# Patient Record
Sex: Male | Born: 1940
Health system: Southern US, Community
[De-identification: ages and names within clinical notes are randomized; demographics above are authoritative.]

## PROBLEM LIST (undated history)

## (undated) DIAGNOSIS — K635 Polyp of colon: Secondary | ICD-10-CM

## (undated) DIAGNOSIS — F419 Anxiety disorder, unspecified: Secondary | ICD-10-CM

## (undated) DIAGNOSIS — R7303 Prediabetes: Secondary | ICD-10-CM

## (undated) DIAGNOSIS — I10 Essential (primary) hypertension: Secondary | ICD-10-CM

## (undated) DIAGNOSIS — M1990 Unspecified osteoarthritis, unspecified site: Secondary | ICD-10-CM

## (undated) DIAGNOSIS — E785 Hyperlipidemia, unspecified: Secondary | ICD-10-CM

## (undated) DIAGNOSIS — I639 Cerebral infarction, unspecified: Secondary | ICD-10-CM

## (undated) HISTORY — PX: EYE SURGERY: SHX253

## (undated) HISTORY — DX: Cerebral infarction, unspecified: I63.9

## (undated) HISTORY — DX: Essential (primary) hypertension: I10

## (undated) HISTORY — DX: Prediabetes: R73.03

## (undated) HISTORY — DX: Polyp of colon: K63.5

## (undated) HISTORY — PX: APPENDECTOMY: SHX54

## (undated) HISTORY — DX: Hyperlipidemia, unspecified: E78.5

## (undated) HISTORY — PX: HERNIA REPAIR: SHX51

---

## 2003-06-23 ENCOUNTER — Emergency Department (HOSPITAL_COMMUNITY): Admission: EM | Admit: 2003-06-23 | Discharge: 2003-06-24 | Payer: Self-pay | Admitting: Emergency Medicine

## 2003-06-23 ENCOUNTER — Encounter: Payer: Self-pay | Admitting: Emergency Medicine

## 2003-12-26 LAB — HM SIGMOIDOSCOPY: HM Sigmoidoscopy: NEGATIVE

## 2005-12-25 LAB — HM COLONOSCOPY

## 2010-12-28 ENCOUNTER — Other Ambulatory Visit: Payer: Self-pay | Admitting: Family Medicine

## 2010-12-28 ENCOUNTER — Ambulatory Visit
Admission: RE | Admit: 2010-12-28 | Discharge: 2010-12-28 | Payer: Self-pay | Source: Home / Self Care | Attending: Family Medicine | Admitting: Family Medicine

## 2010-12-28 DIAGNOSIS — Z8679 Personal history of other diseases of the circulatory system: Secondary | ICD-10-CM | POA: Insufficient documentation

## 2010-12-28 DIAGNOSIS — L821 Other seborrheic keratosis: Secondary | ICD-10-CM | POA: Insufficient documentation

## 2010-12-28 DIAGNOSIS — Z860100 Personal history of colon polyps, unspecified: Secondary | ICD-10-CM | POA: Insufficient documentation

## 2010-12-28 DIAGNOSIS — E785 Hyperlipidemia, unspecified: Secondary | ICD-10-CM | POA: Insufficient documentation

## 2010-12-28 DIAGNOSIS — I10 Essential (primary) hypertension: Secondary | ICD-10-CM | POA: Insufficient documentation

## 2010-12-28 DIAGNOSIS — R7309 Other abnormal glucose: Secondary | ICD-10-CM | POA: Insufficient documentation

## 2010-12-28 LAB — BASIC METABOLIC PANEL
BUN: 23 mg/dL (ref 6–23)
CO2: 30 mEq/L (ref 19–32)
Calcium: 9.6 mg/dL (ref 8.4–10.5)
Chloride: 97 mEq/L (ref 96–112)
Creatinine, Ser: 0.8 mg/dL (ref 0.4–1.5)
GFR: 99.31 mL/min (ref >60.00)
Glucose, Bld: 93 mg/dL (ref 70–99)
Potassium: 3.8 mEq/L (ref 3.5–5.1)
Sodium: 136 mEq/L (ref 135–145)

## 2010-12-28 LAB — LIPID PANEL
Cholesterol: 184 mg/dL (ref 0–200)
HDL: 28.4 mg/dL — ABNORMAL LOW (ref >39.00)
LDL Cholesterol: 117 mg/dL — ABNORMAL HIGH (ref 0–99)
Total CHOL/HDL Ratio: 6
Triglycerides: 195 mg/dL — ABNORMAL HIGH (ref 0.0–149.0)
VLDL: 39 mg/dL (ref 0.0–40.0)

## 2010-12-28 LAB — CONVERTED CEMR LAB
Cholesterol, target level: 200 mg/dL
HDL goal, serum: 40 mg/dL
LDL Goal: 130 mg/dL

## 2011-01-17 NOTE — Assessment & Plan Note (Signed)
Summary: NEW TO EST//ALP   Vital Signs:  Patient profile:   70 year old male Height:      70.78 inches Weight:      176 pounds BMI:     24.79 Temp:     98.1 degrees F oral Pulse rate:   60 / minute Pulse rhythm:   regular Resp:     12 per minute BP sitting:   130 / 88  (left arm) Cuff size:   regular  Vitals Entered By: Sid Falcon LPN (December 28, 2010 10:21 AM) CC: Hypertension Management, Lipid Management   History of Present Illness: Here to establish care. receives most of his care through the [facility].  PMH reviewed.  Hx hypertension, hyperlipidemia, colon polyps, subarachnoid hemorrhage 1995. ?MVP hx. Meds reviewed.  Compliant with meds and no side effects. Gets meds through [facility].  Pt also as reported hx of prediabetes with prior fasting glucose 117.  no symptoms of hyperglycemia.  Brownish colored skin lesions L side of face/head which are sometimes pruritic.  Nonbleeding.  treated in past by dermatology with liquid N but came back.  Hypertension History:      He denies headache, chest pain, palpitations, dyspnea with exertion, orthopnea, PND, peripheral edema, visual symptoms, neurologic problems, syncope, and side effects from treatment.        Positive major cardiovascular risk factors include male age 22 years old or older, hyperlipidemia, and hypertension.  Negative major cardiovascular risk factors include no history of diabetes and non-tobacco-user status.        Further assessment for target organ damage reveals no history of ASHD, stroke/TIA, or peripheral vascular disease.    Lipid Management History:      Positive NCEP/ATP III risk factors include male age 41 years old or older and hypertension.  Negative NCEP/ATP III risk factors include non-diabetic, non-tobacco-user status, no ASHD (atherosclerotic heart disease), no prior stroke/TIA, no peripheral vascular disease, and no history of aortic aneurysm.      Preventive Screening-Counseling &  Management  Alcohol-Tobacco     Smoking Status: never  Caffeine-Diet-Exercise     Does Patient Exercise: yes  Allergies (verified): No Known Drug Allergies  Past History:  Family History: Last updated: 12/28/2010 Father, heart disease 36 mother, diabetes type ll and ?colon cancer  Social History: Last updated: 12/28/2010 Retired Married Never Smoked Alcohol use-no Regular exercise-yes  Risk Factors: Exercise: yes (12/28/2010)  Risk Factors: Smoking Status: never (12/28/2010)  Past Medical History: Colonic polyps, hx of Hypertension Subarachnoid hemorrhage 1995 Hyperlipidemia ?mitral valve prolapse  Past Surgical History: hernia R 1966 hernia L 1983 Appendectomy  1953 PMH-FH-SH reviewed for relevance  Family History: Father, heart disease 70 mother, diabetes type ll and ?colon cancer  Social History: Retired Married Never Smoked Alcohol use-no Regular exercise-yes Smoking Status:  never Does Patient Exercise:  yes  Review of Systems  The patient denies anorexia, fever, weight loss, weight gain, vision loss, decreased hearing, hoarseness, chest pain, syncope, dyspnea on exertion, peripheral edema, prolonged cough, headaches, hemoptysis, abdominal pain, melena, hematochezia, severe indigestion/heartburn, hematuria, incontinence, genital sores, muscle weakness, suspicious skin lesions, transient blindness, difficulty walking, depression, unusual weight change, abnormal bleeding, enlarged lymph nodes, and testicular masses.    Physical Exam  General:  Well-developed,well-nourished,in no acute distress; alert,appropriate and cooperative throughout examination Head:  Normocephalic and atraumatic without obvious abnormalities. No apparent alopecia or balding. Mouth:  Oral mucosa and oropharynx without lesions or exudates.  Teeth in good repair. Neck:  No deformities, masses,  or tenderness noted. Lungs:  Normal respiratory effort, chest expands  symmetrically. Lungs are clear to auscultation, no crackles or wheezes. Heart:  Normal rate and regular rhythm. S1 and S2 normal without gallop, murmur, click, rub or other extra sounds. Abdomen:  Bowel sounds positive,abdomen soft and non-tender without masses, organomegaly or hernias noted. Extremities:  No clubbing, cyanosis, edema, or deformity noted with normal full range of motion of all joints.   Neurologic:  alert & oriented X3 and cranial nerves II-XII intact.   Skin:  L forehead and L temporal skin lesions which are brown, well demarcated and scaly surface.  Forehead lesion 6-7 mm diameter and temporal  14 mm diameter. Cervical Nodes:  No lymphadenopathy noted Psych:  good eye contact, not anxious appearing, and not depressed appearing.     Impression & Recommendations:  Problem # 1:  PREDIABETES (ICD-790.29)  Orders: Specimen Handling (16109) Venipuncture (60454) TLB-BMP (Basic Metabolic Panel-BMET) (80048-METABOL)  Problem # 2:  HYPERLIPIDEMIA (ICD-272.4)  Orders: Specimen Handling (09811) Venipuncture (91478) TLB-Lipid Panel (80061-LIPID)  Problem # 3:  CEREBROVASCULAR ACCIDENT, HX OF (ICD-V12.50)  Problem # 4:  HYPERTENSION (ICD-401.9)  His updated medication list for this problem includes:    Atenolol 50 Mg Tabs (Atenolol) ..... Once daily    Captopril 25 Mg Tabs (Captopril) .Marland Kitchen... 2 tabs two times a day    Hydrochlorothiazide 25 Mg Tabs (Hydrochlorothiazide) ..... Once daily  Problem # 5:  SEBORRHEIC KERATOSIS (ICD-702.19) Assessment: New  pt requests treatment.  Discussed risks and benefits of cryptherapy and pt consents.  L temporal lesion treated with liquid N.  Orders: Cryotherapy/Destruction benign or premalignant lesion (1st lesion)  (17000) Cryotherapy/Destruction benign or premalignant lesion (2nd-14th lesions) (17003)  Problem # 6:  COLONIC POLYPS, HX OF (ICD-V12.72)  Complete Medication List: 1)  Atenolol 50 Mg Tabs (Atenolol) .... Once  daily 2)  Captopril 25 Mg Tabs (Captopril) .... 2 tabs two times a day 3)  Hydrochlorothiazide 25 Mg Tabs (Hydrochlorothiazide) .... Once daily  Hypertension Assessment/Plan:      The patient's hypertensive risk group is category B: At least one risk factor (excluding diabetes) with no target organ damage.  Today's blood pressure is 130/88.    Lipid Assessment/Plan:      Based on NCEP/ATP III, the patient's risk factor category is "2 or more risk factors and a calculated 10 year CAD risk of > 20%".  The patient's lipid goals are as follows: Total cholesterol goal is 200; LDL cholesterol goal is 130; HDL cholesterol goal is 40; Triglyceride goal is 150.    Patient Instructions: 1)  Please schedule a follow-up appointment as needed .    Orders Added: 1)  Specimen Handling [99000] 2)  Venipuncture [36415] 3)  TLB-Lipid Panel [80061-LIPID] 4)  TLB-BMP (Basic Metabolic Panel-BMET) [80048-METABOL] 5)  New Patient Level III [29562] 6)  Cryotherapy/Destruction benign or premalignant lesion (1st lesion)  [17000] 7)  Cryotherapy/Destruction benign or premalignant lesion (2nd-14th lesions) [17003]   Immunization History:  Pneumovax Immunization History:    Pneumovax:  historical (12/17/2007)   Immunization History:  Pneumovax Immunization History:    Pneumovax:  Historical (12/17/2007)  Preventive Care Screening  Last Pneumovax:    Date:  12/17/2007    Results:  Historical   Last Tetanus Booster:    Date:  12/16/2006    Results:  Historical   Colonoscopy:    Date:  12/16/2005    Results:  abnormal      Preventive Care Screening  Last Pneumovax:  Date:  12/17/2007    Results:  Historical   Last Tetanus Booster:    Date:  12/16/2006    Results:  Historical   Colonoscopy:    Date:  12/16/2005    Results:  abnormal

## 2012-04-02 DIAGNOSIS — H4300 Vitreous prolapse, unspecified eye: Secondary | ICD-10-CM | POA: Diagnosis not present

## 2012-04-02 DIAGNOSIS — H43819 Vitreous degeneration, unspecified eye: Secondary | ICD-10-CM | POA: Diagnosis not present

## 2012-04-08 DIAGNOSIS — H4300 Vitreous prolapse, unspecified eye: Secondary | ICD-10-CM | POA: Diagnosis not present

## 2012-04-08 DIAGNOSIS — H5460 Unqualified visual loss, one eye, unspecified: Secondary | ICD-10-CM | POA: Diagnosis not present

## 2012-04-08 DIAGNOSIS — H43399 Other vitreous opacities, unspecified eye: Secondary | ICD-10-CM | POA: Diagnosis not present

## 2012-05-04 DIAGNOSIS — H01009 Unspecified blepharitis unspecified eye, unspecified eyelid: Secondary | ICD-10-CM | POA: Diagnosis not present

## 2012-05-04 DIAGNOSIS — H524 Presbyopia: Secondary | ICD-10-CM | POA: Diagnosis not present

## 2012-05-04 DIAGNOSIS — H5200 Hypermetropia, unspecified eye: Secondary | ICD-10-CM | POA: Diagnosis not present

## 2012-05-04 DIAGNOSIS — H52229 Regular astigmatism, unspecified eye: Secondary | ICD-10-CM | POA: Diagnosis not present

## 2012-12-25 ENCOUNTER — Ambulatory Visit (INDEPENDENT_AMBULATORY_CARE_PROVIDER_SITE_OTHER): Payer: Medicare Other | Admitting: Family Medicine

## 2012-12-25 ENCOUNTER — Encounter: Payer: Self-pay | Admitting: Family Medicine

## 2012-12-25 VITALS — BP 120/80 | Temp 98.7°F | Wt 183.0 lb

## 2012-12-25 DIAGNOSIS — G629 Polyneuropathy, unspecified: Secondary | ICD-10-CM

## 2012-12-25 DIAGNOSIS — G609 Hereditary and idiopathic neuropathy, unspecified: Secondary | ICD-10-CM

## 2012-12-25 DIAGNOSIS — E785 Hyperlipidemia, unspecified: Secondary | ICD-10-CM

## 2012-12-25 DIAGNOSIS — I10 Essential (primary) hypertension: Secondary | ICD-10-CM | POA: Diagnosis not present

## 2012-12-25 LAB — TSH: TSH: 1.82 u[IU]/mL (ref 0.35–5.50)

## 2012-12-25 LAB — BASIC METABOLIC PANEL
BUN: 27 mg/dL — ABNORMAL HIGH (ref 6–23)
CO2: 26 mEq/L (ref 19–32)
Calcium: 9.2 mg/dL (ref 8.4–10.5)
Chloride: 100 mEq/L (ref 96–112)
Creatinine, Ser: 1 mg/dL (ref 0.4–1.5)
GFR: 76.75 mL/min (ref >60.00)
Glucose, Bld: 105 mg/dL — ABNORMAL HIGH (ref 70–99)
Potassium: 3.7 mEq/L (ref 3.5–5.1)
Sodium: 136 mEq/L (ref 135–145)

## 2012-12-25 LAB — VITAMIN B12: Vitamin B-12: 476 pg/mL (ref 211–911)

## 2012-12-25 NOTE — Patient Instructions (Addendum)
Neuropathy Neuropathy means your peripheral nerves are not working normally. Peripheral nerves are the nerves outside the brain and spinal cord. Messages between the brain and the rest of the body do not work properly with peripheral nerve disorders. CAUSES There are many different causes of peripheral nerve disorders. These include:  Injury.   Infections.   Diabetes.   Vitamin deficiency.   Poor circulation.   Alcoholism.   Exposure to toxins.   Drug effects.   Tumors.   Kidney disease.  SYMPTOMS  Tingling, burning, pain, and numbness in the extremities.   Weakness and loss of muscle tone and size.  DIAGNOSIS Blood tests and special studies of nerve function may help confirm the diagnosis.  TREATMENT  Treatment includes adopting healthy life habits.   A good diet, vitamin supplements, and mild pain medicine may be needed.   Avoid known toxins such as alcohol, tobacco, and recreational drugs.   Anti-convulsant medicines are helpful in some types of neuropathy.  Make a follow-up appointment with your caregiver to be sure you are getting better with treatment.  SEEK IMMEDIATE MEDICAL CARE IF:   You have breathing problems.   You have severe or uncontrolled pain.   You notice extreme weakness or you feel faint.   You are not better after 1 week or if you have worse symptoms.  Document Released: 01/09/2005 Document Revised: 08/14/2011 Document Reviewed: 12/02/2005 ExitCare Patient Information 2012 [employer]. 

## 2012-12-25 NOTE — Progress Notes (Signed)
  Subjective:    Patient ID: Joseph Mcintyre, male    DOB: 12-27-1940, 72 y.o.   MRN: [id number]  HPI  Bilateral foot burning to "warm" pain/discomfort.  Better at night.  3/10 severity.  No leg involvement. No claudication.  Symptoms at rest.  No alleviating.  No exacerbating. ?hx prediabetes.  No symptoms of hyperglycemia. No prior hx of neuropathy.  No LBP. No upper extremity numbness.  Past Medical History  Diagnosis Date  . Cerebrovascular accident   . Colon polyps   . Hyperlipidemia   . Hypertension   . Prediabetes    Past Surgical History  Procedure Date  . Hernia repair 1983, 1966  . Appendectomy     reports that he has never smoked. He does not have any smokeless tobacco history on file. His alcohol and drug histories not on file. family history includes Diabetes in his mother and Heart disease in his father. No Known Allergies    Review of Systems  Constitutional: Negative for fever, chills, appetite change and unexpected weight change.  Respiratory: Negative for cough.   Cardiovascular: Negative for chest pain.  Gastrointestinal: Negative for abdominal pain.  Musculoskeletal: Negative for back pain.  Neurological: Positive for numbness. Negative for weakness.  Hematological: Negative for adenopathy. Does not bruise/bleed easily.       Objective:   Physical Exam  Constitutional: He appears well-developed and well-nourished.  Neck: Neck supple. No thyromegaly present.  Cardiovascular: Normal rate and regular rhythm.   Pulmonary/Chest: Effort normal and breath sounds normal. No respiratory distress. He has no wheezes. He has no rales.  Musculoskeletal: He exhibits no edema.       Feet are nontender. Good distal foot pulses. Feet are warm to touch with good capillary refill  Neurological:        Full-strength lower extremities. Normal sensory function to touch. Trace knee reflexes bilaterally and 1+ Achilles bilaterally          Assessment & Plan:    Bilateral neuropathy symptoms. Symptoms are relatively mild and symmetric. Check labs with basic metabolic panel, B12, and TSH. We discussed possible treatment options such as gabapentin at this point he wishes to wait symptoms are relatively mild. Followup promptly for any progressive symptoms or new symptoms such as weakness

## 2012-12-29 NOTE — Progress Notes (Signed)
Quick Note:  Pt wife informed ______ 

## 2013-03-17 ENCOUNTER — Encounter: Payer: Self-pay | Admitting: Family Medicine

## 2013-03-17 ENCOUNTER — Telehealth: Payer: Self-pay | Admitting: Family Medicine

## 2013-03-17 ENCOUNTER — Ambulatory Visit (INDEPENDENT_AMBULATORY_CARE_PROVIDER_SITE_OTHER): Payer: Medicare Other | Admitting: Family Medicine

## 2013-03-17 VITALS — BP 140/80 | Temp 98.2°F | Wt 182.0 lb

## 2013-03-17 DIAGNOSIS — H8110 Benign paroxysmal vertigo, unspecified ear: Secondary | ICD-10-CM | POA: Diagnosis not present

## 2013-03-17 NOTE — Progress Notes (Signed)
  Subjective:    Patient ID: Joseph Mcintyre, male    DOB: 26-Feb-1941, 72 y.o.   MRN: [id number]  HPI  Onset yesterday of vertigo about 12 noon. Had some nausea and vomiting yesterday but none today. No visual changes.  No speech changes. No focal weakness.  No ataxia.  Bilateral ear fullness yesterday.   No tinnitus. Vertigo worse with movement.   Similar, though milder, episodes in past.  Past Medical History  Diagnosis Date  . Cerebrovascular accident   . Colon polyps   . Hyperlipidemia   . Hypertension   . Prediabetes    Past Surgical History  Procedure Laterality Date  . Hernia repair  1983, 1966  . Appendectomy      reports that he has never smoked. He does not have any smokeless tobacco history on file. His alcohol and drug histories are not on file. family history includes Diabetes in his mother and Heart disease in his father. No Known Allergies    Review of Systems  Constitutional: Negative for fever, chills and unexpected weight change.  Respiratory: Negative for cough and shortness of breath.   Cardiovascular: Negative for chest pain.  Neurological: Positive for dizziness. Negative for tremors, seizures, syncope, weakness and light-headedness.  Psychiatric/Behavioral: Negative for confusion.       Objective:   Physical Exam  Constitutional: He is oriented to person, place, and time. He appears well-developed and well-nourished.  HENT:  Right Ear: External ear normal.  Left Ear: External ear normal.  Eyes: Pupils are equal, round, and reactive to light.  Neck: Neck supple.  No carotid bruit  Cardiovascular: Normal rate, regular rhythm and normal heart sounds.   Pulmonary/Chest: Effort normal and breath sounds normal. No respiratory distress. He has no wheezes. He has no rales.  Musculoskeletal: He exhibits no edema.  Neurological: He is alert and oriented to person, place, and time. No cranial nerve deficit.  No focal weakness. Cerebellar normal finger  to nose testing. Gait normal. No focal weakness          Assessment & Plan:  Vertigo. Suspect benign peripheral positional vertigo. Nonfocal exam. Symptoms are actually somewhat improved at this time. Reassurance. Followup promptly for any worsening symptoms or new symptoms

## 2013-03-17 NOTE — Patient Instructions (Addendum)
Benign Positional Vertigo  Vertigo means you feel like you or your surroundings are moving when they are not. Benign positional vertigo is the most common form of vertigo. Benign means that the cause of your condition is not serious. Benign positional vertigo is more common in older adults.  CAUSES   Benign positional vertigo is the result of an upset in the labyrinth system. This is an area in the middle ear that helps control your balance. This may be caused by a viral infection, head injury, or repetitive motion. However, often no specific cause is found.  SYMPTOMS   Symptoms of benign positional vertigo occur when you move your head or eyes in different directions. Some of the symptoms may include:  · Loss of balance and falls.  · Vomiting.  · Blurred vision.  · Dizziness.  · Nausea.  · Involuntary eye movements (nystagmus).  DIAGNOSIS   Benign positional vertigo is usually diagnosed by physical exam. If the specific cause of your benign positional vertigo is unknown, your caregiver may perform imaging tests, such as magnetic resonance imaging (MRI) or computed tomography (CT).  TREATMENT   Your caregiver may recommend movements or procedures to correct the benign positional vertigo. Medicines such as meclizine, benzodiazepines, and medicines for nausea may be used to treat your symptoms. In rare cases, if your symptoms are caused by certain conditions that affect the inner ear, you may need surgery.  HOME CARE INSTRUCTIONS   · Follow your caregiver's instructions.  · Move slowly. Do not make sudden body or head movements.  · Avoid driving.  · Avoid operating heavy machinery.  · Avoid performing any tasks that would be dangerous to you or others during a vertigo episode.  · Drink enough fluids to keep your urine clear or pale yellow.  SEEK IMMEDIATE MEDICAL CARE IF:   · You develop problems with walking, weakness, numbness, or using your arms, hands, or legs.  · You have difficulty speaking.  · You develop  severe headaches.  · Your nausea or vomiting continues or gets worse.  · You develop visual changes.  · Your family or friends notice any behavioral changes.  · Your condition gets worse.  · You have a fever.  · You develop a stiff neck or sensitivity to light.  MAKE SURE YOU:   · Understand these instructions.  · Will watch your condition.  · Will get help right away if you are not doing well or get worse.  Document Released: 09/09/2006 Document Revised: 02/24/2012 Document Reviewed: 08/22/2011  ExitCare® Patient Information ©2013 [employer].

## 2013-03-17 NOTE — Telephone Encounter (Signed)
Patient Information:  Caller Name: Jo  Phone: [phone]  Patient: Zidane, Renner  Gender: Male  DOB: 12/08/1941  Age: 72 Years  PCP: Evelena Peat [facility])  Office Follow Up:  Does the office need to follow up with this patient?: Yes  Instructions For The Office: Triage to office Now. Patient wants to see Dr. Caryl Never.  Scheduled at 13:15 today 03/17/13.  PLEASE ADVISE IF EARLIER SCHEDULING.   Symptoms  Reason For Call & Symptoms: Patient states he has been having symptoms of vertigo for 24 hours. dizzy and off balance and nausea. One episode of Vomiting. Mild headache. Decreased appetite. Assistance with help of wife.  Had to hold on to something going to bathroom.  Reviewed Health History In EMR: Yes  Reviewed Medications In EMR: Yes  Reviewed Allergies In EMR: Yes  Reviewed Surgeries / Procedures: Yes  Date of Onset of Symptoms: 03/16/2013  Treatments Tried: ASA  Treatments Tried Worked: No  Guideline(s) Used:  Dizziness  Disposition Per Guideline:   Go to Office Now  Reason For Disposition Reached:   Lightheadedness (dizziness) present now, after 2 hours of rest and fluids  Advice Given:  Some Causes of Temporary Dizziness:  Poor Fluid Intake - Not drinking enough fluids and being a little dehydrated is a common cause of temporary dizziness. This is always worse during hot weather.  Standing Up Suddenly - Standing up suddenly (especially getting out of bed) or prolonged standing in one place are common causes of temporary dizziness. Not drinking enough fluids always makes it worse. Certain medications can cause or increase this type of dizziness (e.g., blood pressure medications).  Heat Exposure - Hot weather, hot tubs, or too much sun exposure are common causes of temporary dizziness. Not drinking enough fluids always makes it worse.  Drink Fluids:  Drink several glasses of fruit juice, other clear fluids, or water. This will improve hydration and blood  glucose. If you have a fever or have had heat exposure, make sure the fluids are cold.  Rest for 1-2 Hours:  Lie down with feet elevated for 1 hour. This will improve blood flow and increase blood flow to the brain.  Stand Up Slowly:  In the mornings, sit up for a few minutes before you stand up. That will help your blood flow make the adjustment.  If you have to stand up for long periods of time, contract and relax your leg muscles to help pump the blood back to the heart.  Sit down or lie down if you feel dizzy.  Call Back If:  Passes out (faints)  You become worse.  Patient Will Follow Care Advice:  YES  Appointment Scheduled:  03/17/2013 13:15:00 Appointment Scheduled Provider:  Evelena Peat [facility])

## 2013-05-13 DIAGNOSIS — H5200 Hypermetropia, unspecified eye: Secondary | ICD-10-CM | POA: Diagnosis not present

## 2013-05-13 DIAGNOSIS — H01029 Squamous blepharitis unspecified eye, unspecified eyelid: Secondary | ICD-10-CM | POA: Diagnosis not present

## 2013-05-13 DIAGNOSIS — H524 Presbyopia: Secondary | ICD-10-CM | POA: Diagnosis not present

## 2013-05-13 DIAGNOSIS — H52229 Regular astigmatism, unspecified eye: Secondary | ICD-10-CM | POA: Diagnosis not present

## 2013-11-15 DIAGNOSIS — M109 Gout, unspecified: Secondary | ICD-10-CM | POA: Diagnosis not present

## 2013-11-19 DIAGNOSIS — M109 Gout, unspecified: Secondary | ICD-10-CM | POA: Diagnosis not present

## 2014-05-16 DIAGNOSIS — H01029 Squamous blepharitis unspecified eye, unspecified eyelid: Secondary | ICD-10-CM | POA: Diagnosis not present

## 2014-05-16 DIAGNOSIS — Z961 Presence of intraocular lens: Secondary | ICD-10-CM | POA: Diagnosis not present

## 2014-05-16 DIAGNOSIS — H35379 Puckering of macula, unspecified eye: Secondary | ICD-10-CM | POA: Diagnosis not present

## 2014-11-21 DIAGNOSIS — H01024 Squamous blepharitis left upper eyelid: Secondary | ICD-10-CM | POA: Diagnosis not present

## 2014-11-21 DIAGNOSIS — H01021 Squamous blepharitis right upper eyelid: Secondary | ICD-10-CM | POA: Diagnosis not present

## 2014-11-21 DIAGNOSIS — H35371 Puckering of macula, right eye: Secondary | ICD-10-CM | POA: Diagnosis not present

## 2015-05-23 DIAGNOSIS — H0234 Blepharochalasis left upper eyelid: Secondary | ICD-10-CM | POA: Diagnosis not present

## 2015-05-23 DIAGNOSIS — H0231 Blepharochalasis right upper eyelid: Secondary | ICD-10-CM | POA: Diagnosis not present

## 2015-05-23 DIAGNOSIS — H35371 Puckering of macula, right eye: Secondary | ICD-10-CM | POA: Diagnosis not present

## 2015-08-03 DIAGNOSIS — M7041 Prepatellar bursitis, right knee: Secondary | ICD-10-CM | POA: Diagnosis not present

## 2015-11-21 DIAGNOSIS — H35371 Puckering of macula, right eye: Secondary | ICD-10-CM | POA: Diagnosis not present

## 2016-02-07 ENCOUNTER — Encounter: Payer: Self-pay | Admitting: Family Medicine

## 2016-02-07 ENCOUNTER — Ambulatory Visit (INDEPENDENT_AMBULATORY_CARE_PROVIDER_SITE_OTHER): Payer: Medicare Other | Admitting: Family Medicine

## 2016-02-07 VITALS — BP 170/93 | HR 72 | Temp 98.8°F | Ht 71.0 in | Wt 172.0 lb

## 2016-02-07 DIAGNOSIS — S39012A Strain of muscle, fascia and tendon of lower back, initial encounter: Secondary | ICD-10-CM

## 2016-02-07 MED ORDER — CYCLOBENZAPRINE HCL 10 MG PO TABS: 10.0000 mg | ORAL_TABLET | Freq: Three times a day (TID) | ORAL | Status: DC | PRN

## 2016-02-07 MED ORDER — DICLOFENAC SODIUM 75 MG PO TBEC: 75.0000 mg | DELAYED_RELEASE_TABLET | Freq: Two times a day (BID) | ORAL | Status: DC

## 2016-02-07 NOTE — Progress Notes (Signed)
Pre visit review using our clinic review tool, if applicable. No additional management support is needed unless otherwise documented below in the visit note. 

## 2016-02-08 NOTE — Progress Notes (Signed)
   Subjective:    Patient ID: Joseph Mcintyre, male    DOB: 01-21-1941, 75 y.o.   MRN: [id number]  HPI Here for 2 weeks of low back pain that radiates into both buttocks. No numbness or weakness in the legs. This started after he spent an afternoon chopping wood. He has used heat and aspirin with partial relief.    Review of Systems  Constitutional: Negative.   Musculoskeletal: Positive for back pain.       Objective:   Physical Exam  Constitutional: He appears well-developed and well-nourished.  Musculoskeletal:  Mildly tender in the lower back with some spasm. Full ROM. Negative SLR           Assessment & Plan:  Low back strain. Use Flexeril and Diclofenac prn. Heat, stretches prn

## 2016-02-27 DIAGNOSIS — M5136 Other intervertebral disc degeneration, lumbar region: Secondary | ICD-10-CM | POA: Diagnosis not present

## 2016-02-27 DIAGNOSIS — S335XXA Sprain of ligaments of lumbar spine, initial encounter: Secondary | ICD-10-CM | POA: Diagnosis not present

## 2016-03-15 DIAGNOSIS — S335XXA Sprain of ligaments of lumbar spine, initial encounter: Secondary | ICD-10-CM | POA: Diagnosis not present

## 2016-03-18 DIAGNOSIS — M545 Low back pain: Secondary | ICD-10-CM | POA: Diagnosis not present

## 2016-03-20 DIAGNOSIS — M4806 Spinal stenosis, lumbar region: Secondary | ICD-10-CM | POA: Diagnosis not present

## 2016-04-01 DIAGNOSIS — M545 Low back pain: Secondary | ICD-10-CM | POA: Diagnosis not present

## 2016-04-01 DIAGNOSIS — M4806 Spinal stenosis, lumbar region: Secondary | ICD-10-CM | POA: Diagnosis not present

## 2016-04-17 DIAGNOSIS — M4806 Spinal stenosis, lumbar region: Secondary | ICD-10-CM | POA: Diagnosis not present

## 2016-04-26 DIAGNOSIS — M545 Low back pain: Secondary | ICD-10-CM | POA: Diagnosis not present

## 2016-04-26 DIAGNOSIS — M4806 Spinal stenosis, lumbar region: Secondary | ICD-10-CM | POA: Diagnosis not present

## 2016-05-10 DIAGNOSIS — M545 Low back pain: Secondary | ICD-10-CM | POA: Diagnosis not present

## 2016-05-20 DIAGNOSIS — H35371 Puckering of macula, right eye: Secondary | ICD-10-CM | POA: Diagnosis not present

## 2016-05-20 DIAGNOSIS — H01021 Squamous blepharitis right upper eyelid: Secondary | ICD-10-CM | POA: Diagnosis not present

## 2016-05-20 DIAGNOSIS — H01024 Squamous blepharitis left upper eyelid: Secondary | ICD-10-CM | POA: Diagnosis not present

## 2016-05-22 DIAGNOSIS — M545 Low back pain: Secondary | ICD-10-CM | POA: Diagnosis not present

## 2016-05-22 DIAGNOSIS — M5136 Other intervertebral disc degeneration, lumbar region: Secondary | ICD-10-CM | POA: Diagnosis not present

## 2016-05-22 DIAGNOSIS — M4806 Spinal stenosis, lumbar region: Secondary | ICD-10-CM | POA: Diagnosis not present

## 2016-06-13 ENCOUNTER — Ambulatory Visit (INDEPENDENT_AMBULATORY_CARE_PROVIDER_SITE_OTHER): Payer: Medicare Other | Admitting: Podiatry

## 2016-06-13 ENCOUNTER — Encounter: Payer: Self-pay | Admitting: Podiatry

## 2016-06-13 ENCOUNTER — Ambulatory Visit (INDEPENDENT_AMBULATORY_CARE_PROVIDER_SITE_OTHER): Payer: Medicare Other

## 2016-06-13 VITALS — BP 150/86 | HR 63 | Resp 12

## 2016-06-13 DIAGNOSIS — M21619 Bunion of unspecified foot: Secondary | ICD-10-CM | POA: Diagnosis not present

## 2016-06-13 DIAGNOSIS — M79672 Pain in left foot: Secondary | ICD-10-CM

## 2016-06-13 DIAGNOSIS — M1000 Idiopathic gout, unspecified site: Secondary | ICD-10-CM

## 2016-06-13 DIAGNOSIS — M79671 Pain in right foot: Secondary | ICD-10-CM

## 2016-06-13 DIAGNOSIS — M779 Enthesopathy, unspecified: Secondary | ICD-10-CM

## 2016-06-13 MED ORDER — TRIAMCINOLONE ACETONIDE 10 MG/ML IJ SUSP
10.0000 mg | Freq: Once | INTRAMUSCULAR | Status: AC
  Administered 2016-06-13: 10 mg

## 2016-06-13 NOTE — Progress Notes (Signed)
   Subjective:    Patient ID: KADARI FIER, male    DOB: February 03, 1941, 75 y.o.   MRN: [id number]  HPI  Chief Complaint  Patient presents with  . Foot Pain    ''b/l feet get sore sometimes.''   PT STATED LT FOOT 1ST JOINT IS MORE PAINFUL THAN RT FOOT. FEET ARE WORSE AT NIGHT. TRIED ULORIC AND DICLOFENAC PRESCRIBE BY DR. Sarajane Jews  Review of Systems  Musculoskeletal: Positive for joint swelling.  Skin: Positive for color change.       Objective:   Physical Exam        Assessment & Plan:

## 2016-06-13 NOTE — Progress Notes (Signed)
Subjective:     Patient ID: Joseph Mcintyre, male   DOB: December 21, 1940, 75 y.o.   MRN: [id number]  HPI patient presents stating that he's had a lot of inflammation around the big toe joint left with a tentative diagnosis of gout. He's tried [place] and diclofenac without relief and the pain has been relatively consistent or the last several months. States that he's not sure as to what he may have done but it's been red and irritated   Review of Systems  All other systems reviewed and are negative.      Objective:   Physical Exam  Constitutional: He is oriented to person, place, and time.  Cardiovascular: Intact distal pulses.   Musculoskeletal: Normal range of motion.  Neurological: He is oriented to person, place, and time.  Skin: Skin is warm.  Nursing note and vitals reviewed.  Neurovascular status found to be intact muscle strength is adequate range of motion within normal limits with patient found to have inflammation around the left first MPJ with prominence and enlargement and fluid buildup. The right one looks fairly normal with slight enlargement noted and there is quite a bit of discomfort when I pressed around the left first MPJ. There is slight range of motion loss crepitus within the joint and patient's found to be well oriented 3 with good digital perfusion     Assessment:     Inflammatory capsulitis first MPJ left with fluid buildup with possibility for previous gout attacks with structural bunion deformity present clinically    Plan:     H&P and x-rays reviewed. Today I went ahead and I did careful capsular injection around this area 3 mg Kenalog 5 mg Xylocaine and advised on wider shoes. We will reevaluate in 1 month and if symptoms are persisting work and the need to consider structural correction  X-ray report indicated there is slight enlargement around the first metatarsal left with fluid buildup and indications mildly of hallux limitus condition

## 2016-06-14 ENCOUNTER — Encounter (HOSPITAL_COMMUNITY): Payer: Self-pay | Admitting: Emergency Medicine

## 2016-06-14 ENCOUNTER — Emergency Department (HOSPITAL_COMMUNITY): Payer: Medicare Other

## 2016-06-14 ENCOUNTER — Emergency Department (HOSPITAL_COMMUNITY)
Admission: EM | Admit: 2016-06-14 | Discharge: 2016-06-14 | Disposition: A | Discharge status: Home / Self Care | Payer: Medicare Other | Attending: Emergency Medicine | Admitting: Emergency Medicine

## 2016-06-14 DIAGNOSIS — R0600 Dyspnea, unspecified: Secondary | ICD-10-CM

## 2016-06-14 DIAGNOSIS — Z79899 Other long term (current) drug therapy: Secondary | ICD-10-CM | POA: Insufficient documentation

## 2016-06-14 DIAGNOSIS — R0602 Shortness of breath: Secondary | ICD-10-CM | POA: Diagnosis not present

## 2016-06-14 DIAGNOSIS — I10 Essential (primary) hypertension: Secondary | ICD-10-CM | POA: Insufficient documentation

## 2016-06-14 DIAGNOSIS — E785 Hyperlipidemia, unspecified: Secondary | ICD-10-CM | POA: Diagnosis not present

## 2016-06-14 LAB — CBC WITH DIFFERENTIAL/PLATELET
Basophils Absolute: 0 10*3/uL (ref 0.0–0.1)
Basophils Relative: 0 %
Eosinophils Absolute: 0 10*3/uL (ref 0.0–0.7)
Eosinophils Relative: 0 %
HEMATOCRIT: 43.4 % (ref 39.0–52.0)
HEMOGLOBIN: 14.8 g/dL (ref 13.0–17.0)
LYMPHS ABS: 1.9 10*3/uL (ref 0.7–4.0)
Lymphocytes Relative: 19 %
MCH: 29.5 pg (ref 26.0–34.0)
MCHC: 34.1 g/dL (ref 30.0–36.0)
MCV: 86.5 fL (ref 78.0–100.0)
MONO ABS: 0.6 10*3/uL (ref 0.1–1.0)
MONOS PCT: 6 %
NEUTROS ABS: 7.5 10*3/uL (ref 1.7–7.7)
Neutrophils Relative %: 75 %
Platelets: 201 10*3/uL (ref 150–400)
RBC: 5.02 MIL/uL (ref 4.22–5.81)
RDW: 12.7 % (ref 11.5–15.5)
WBC: 10 10*3/uL (ref 4.0–10.5)

## 2016-06-14 LAB — TROPONIN I

## 2016-06-14 LAB — D-DIMER, QUANTITATIVE: D-Dimer, Quant: 0.42 ug/mL-FEU (ref 0.00–0.50)

## 2016-06-14 LAB — COMPREHENSIVE METABOLIC PANEL
ALK PHOS: 54 U/L (ref 38–126)
ALT: 16 U/L — AB (ref 17–63)
ANION GAP: 9 (ref 5–15)
AST: 20 U/L (ref 15–41)
Albumin: 4.1 g/dL (ref 3.5–5.0)
BILIRUBIN TOTAL: 1 mg/dL (ref 0.3–1.2)
BUN: 18 mg/dL (ref 6–20)
CALCIUM: 9.7 mg/dL (ref 8.9–10.3)
CO2: 29 mmol/L (ref 22–32)
CREATININE: 0.85 mg/dL (ref 0.61–1.24)
Chloride: 98 mmol/L — ABNORMAL LOW (ref 101–111)
Glucose, Bld: 103 mg/dL — ABNORMAL HIGH (ref 65–99)
Potassium: 3.9 mmol/L (ref 3.5–5.1)
Sodium: 136 mmol/L (ref 135–145)
TOTAL PROTEIN: 6.8 g/dL (ref 6.5–8.1)

## 2016-06-14 MED ORDER — LORAZEPAM 1 MG PO TABS
1.0000 mg | ORAL_TABLET | Freq: Once | ORAL | Status: AC
  Administered 2016-06-14: 1 mg via ORAL
  Filled 2016-06-14: qty 1

## 2016-06-14 NOTE — ED Notes (Signed)
Pt c/o [facility] for several days and also had an episode of syncope. History of toe injection yesterday for gout

## 2016-06-14 NOTE — ED Provider Notes (Addendum)
CSN: [id number]     Arrival date & time 06/14/16  [id number] History   First MD Initiated Contact with Patient 06/14/16 [phone]     Chief Complaint  Patient presents with  . Shortness of Breath    SHOB for several days     HPI Pt with transient intermittent SOB x 3-4 days. Initially began as a feeling of not feeling well while driving, followed by dimming of his vision and feeling like he was going to pass out, with unclear transient LOC but quickly came to and continued driving 4 days ago. No CP at that time or palpitations. No fever or chills or cough. Reports since that event he has awoken with SOB in the morning for past several days followed by relatively normal afternoons. Reports at times he feels anxious. Spouse reports poor sleep for several night. Pt denies orthopnea or leg swelling. No hx of CHF. Asymptomatic at this time. No hx of PE. Wife reports 20lb weight loss over two months but admits to changes in the way they cook and eat at home during that period of time. No hx of CAD. Transient smoker in his teens, but nothing sustained. Reports medication compliance.     Past Medical History  Diagnosis Date  . Cerebrovascular accident ([place])   . Colon polyps   . Hyperlipidemia   . Hypertension   . Prediabetes    Past Surgical History  Procedure Laterality Date  . Hernia repair  1983, 1966  . Appendectomy     Family History  Problem Relation Age of Onset  . Diabetes Mother     type ll  . Heart disease Father    Social History  Substance Use Topics  . Smoking status: Never Smoker   . Smokeless tobacco: Never Used  . Alcohol Use: No    Review of Systems  All other systems reviewed and are negative.     Allergies  Review of patient's allergies indicates no known allergies.  Home Medications   Prior to Admission medications   Medication Sig Start Date End Date Taking? Authorizing Provider  atenolol (TENORMIN) 50 MG tablet Take 50 mg by mouth daily.   Yes Historical  Provider, MD  captopril (CAPOTEN) 25 MG tablet Take 50 mg by mouth 2 (two) times daily.    Yes Historical Provider, MD  hydrochlorothiazide (HYDRODIURIL) 25 MG tablet Take 25 mg by mouth daily.   Yes Historical Provider, MD  traMADol (ULTRAM) 50 MG tablet Take 50 mg by mouth every 6 (six) hours as needed for moderate pain.   Yes Historical Provider, MD  cyclobenzaprine (FLEXERIL) 10 MG tablet Take 1 tablet (10 mg total) by mouth 3 (three) times daily as needed for muscle spasms. Patient not taking: Reported on 06/14/2016 02/07/16   Laurey Morale, MD  diclofenac (VOLTAREN) 75 MG EC tablet Take 1 tablet (75 mg total) by mouth 2 (two) times daily. Patient not taking: Reported on 06/14/2016 02/07/16   Laurey Morale, MD   BP 131/77 mmHg  Pulse 74  Temp(Src) 98.1 F (36.7 C) (Oral)  Resp 16  SpO2 95% Physical Exam  Constitutional: He is oriented to person, place, and time. He appears well-developed and well-nourished.  HENT:  Head: Normocephalic and atraumatic.  Eyes: EOM are normal.  Neck: Normal range of motion.  Cardiovascular: Normal rate, regular rhythm, normal heart sounds and intact distal pulses.   Pulmonary/Chest: Effort normal and breath sounds normal. No respiratory distress.  Abdominal: Soft. He exhibits no distension.  There is no tenderness.  Musculoskeletal: Normal range of motion.  Neurological: He is alert and oriented to person, place, and time.  Skin: Skin is warm and dry.  Psychiatric: He has a normal mood and affect. Judgment normal.  Nursing note and vitals reviewed.   ED Course  Procedures (including critical care time) Labs Review Labs Reviewed  COMPREHENSIVE METABOLIC PANEL - Abnormal; Notable for the following:    Chloride 98 (*)    Glucose, Bld 103 (*)    ALT 16 (*)    All other components within normal limits  CBC WITH DIFFERENTIAL/PLATELET  D-DIMER, QUANTITATIVE (NOT AT [facility])  TROPONIN I    Imaging Review Dg Chest 2 View  06/14/2016  CLINICAL DATA:   Shortness of Breath EXAM: CHEST  2 VIEW COMPARISON:  None. FINDINGS: There is no edema or consolidation. The heart size and pulmonary vascularity are normal. There is atherosclerotic calcification in the aorta. No adenopathy. No bone lesions. There is calcification in the carotid arteries bilaterally. IMPRESSION: No edema or consolidation. Aortic atherosclerosis. Foci of carotid artery calcification bilaterally also noted. Electronically Signed   By: Lowella Grip III M.D.   On: 06/14/2016 11:38   I have personally reviewed and evaluated these images and lab results as part of my medical decision-making.   EKG Interpretation #1 Date/Time:  Friday June 14 2016 09:44:34 EDT Ventricular Rate:  67 PR Interval:    QRS Duration: 96 QT Interval:  398 QTC Calculation: 421 R Axis:   -10 Text Interpretation:  Sinus rhythm No old tracing to compare Confirmed by  Keiran Sias  MD, Josten Warmuth ([id number]) on 06/14/2016 10:44:10 AM     EKG Interpretation #2  Date/Time:  Friday June 14 2016 12:30:49 EDT Ventricular Rate:  75 PR Interval:    QRS Duration: 94 QT Interval:  389 QTC Calculation: 435 R Axis:   3 Text Interpretation:  Sinus rhythm Abnormal R-wave progression, early transition No significant change was found Confirmed by Oran Dillenburg  MD, Larico Dimock ([id number]) on 06/14/2016 10:23:48 PM            MDM   Final diagnoses:  Dyspnea    Pt had a transient episode while in ER of SOB. No arrhythmia noted. No ecg changes noted. Resolved on its own. Feels better after ativan. ecg without ischemic changes. Work up without significant abnormality. Dc home with primary care and cardiology follow up. Some of this sounds like it could represent anxiety but he will likely benefit from outpatient echo and possible holter monitor. He and is family understand to return to the ER for new or worsening symptoms. All questions answered  Ambulated in the hall prior to discharge without difficulty. No hypoxia. Well appearing. No  increased work of breathing  Jola Schmidt, MD 06/14/16 [place], MD 06/14/16 [id number]

## 2016-06-14 NOTE — ED Notes (Signed)
Patient transported to X-ray 

## 2016-06-17 ENCOUNTER — Encounter: Payer: Self-pay | Admitting: Family Medicine

## 2016-06-17 ENCOUNTER — Ambulatory Visit (INDEPENDENT_AMBULATORY_CARE_PROVIDER_SITE_OTHER): Payer: Medicare Other | Admitting: Family Medicine

## 2016-06-17 ENCOUNTER — Telehealth: Payer: Self-pay | Admitting: *Deleted

## 2016-06-17 VITALS — BP 110/70 | HR 78 | Temp 98.2°F | Resp 16 | Ht 71.0 in | Wt 155.0 lb

## 2016-06-17 DIAGNOSIS — Z789 Other specified health status: Secondary | ICD-10-CM

## 2016-06-17 DIAGNOSIS — R5383 Other fatigue: Secondary | ICD-10-CM | POA: Diagnosis not present

## 2016-06-17 DIAGNOSIS — R55 Syncope and collapse: Secondary | ICD-10-CM

## 2016-06-17 DIAGNOSIS — R6889 Other general symptoms and signs: Secondary | ICD-10-CM

## 2016-06-17 LAB — SEDIMENTATION RATE: Sed Rate: 13 mm/hr (ref 0–20)

## 2016-06-17 LAB — TSH: TSH: 1.13 u[IU]/mL (ref 0.35–4.50)

## 2016-06-17 MED ORDER — LORAZEPAM 0.5 MG PO TABS: 0.5000 mg | ORAL_TABLET | Freq: Four times a day (QID) | ORAL | Status: DC | PRN

## 2016-06-17 NOTE — Patient Instructions (Signed)
Follow up for any recurrent dizziness or syncope.

## 2016-06-17 NOTE — Progress Notes (Signed)
Pre visit review using our clinic review tool, if applicable. No additional management support is needed unless otherwise documented below in the visit note. 
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
## 2016-06-17 NOTE — Progress Notes (Signed)
Subjective:    Patient ID: Joseph Mcintyre, male    DOB: Oct 24, 1941, 75 y.o.   MRN: [id number]  HPI Patient seen for ER follow-up.  He states that one week ago today use driving along with his wife and had very sudden onset of presyncopal type symptoms which lasted just a few seconds. He denies ever losing consciousness. He denied any associated chest pains or shortness of breath with that episode. No confusion. He states he felt "as if he were going to black out " He has not had any episodes since then.  He also relates having some intermittent shortness of breath (nonexertional) recently. He feels anxious during these episodes. He went to emergency room last Friday for further evaluation. He's also had about 18-20 pounds of weight loss over the past several months. They have made some dietary changes but he states his appetite is diminished somewhat. He states he has never had exertional dyspnea and in fact he does not notice any symptoms of dyspnea when being active. Symptoms have occurred only at rest.  ER notes reviewed. He had comprehensive metabolic panel, troponin, d-dimer, CBC all unremarkable. Chest x-ray no acute findings. EKG no acute changes. He started to feel some shortness of breath during ER visit and there were no corresponding EKG changes or arrhythmias. Patient was given 1 dose of lorazepam and symptoms seemed to improve.  He has been getting some epidurals for lumbar stenosis. Most recent injection was June 8.  Patient smoked only briefly in his teens. He has hypertension which is currently treated with atenolol, captopril, and HCTZ.  Past Medical History  Diagnosis Date  . Cerebrovascular accident ([place])   . Colon polyps   . Hyperlipidemia   . Hypertension   . Prediabetes    Past Surgical History  Procedure Laterality Date  . Hernia repair  1983, 1966  . Appendectomy      reports that he has never smoked. He has never used smokeless tobacco. He reports that he  does not drink alcohol or use illicit drugs. family history includes Diabetes in his mother; Heart disease in his father. No Known Allergies    Review of Systems  Constitutional: Positive for appetite change, fatigue and unexpected weight change. Negative for fever and chills.  HENT: Negative for trouble swallowing.   Respiratory: Positive for shortness of breath.   Cardiovascular: Negative for chest pain, palpitations and leg swelling.  Gastrointestinal: Negative for nausea, vomiting and abdominal pain.  Genitourinary: Negative for dysuria.  Musculoskeletal: Positive for back pain.  Neurological: Positive for dizziness. Negative for seizures, syncope, speech difficulty and headaches.  Psychiatric/Behavioral: The patient is nervous/anxious.        Objective:   Physical Exam  Constitutional: He is oriented to person, place, and time. He appears well-developed and well-nourished.  HENT:  Mouth/Throat: Oropharynx is clear and moist.  Neck: Neck supple. No thyromegaly present.  Cardiovascular: Normal rate and regular rhythm.  Exam reveals no gallop.   No murmur heard. Pulmonary/Chest: Effort normal and breath sounds normal. No respiratory distress. He has no wheezes. He has no rales.  Abdominal: Soft. Bowel sounds are normal. He exhibits no distension and no mass. There is no tenderness. There is no rebound and no guarding.  Musculoskeletal: He exhibits no edema.  Lymphadenopathy:    He has no cervical adenopathy.  Neurological: He is alert and oriented to person, place, and time. No cranial nerve deficit.  Full strength throughout. Normal cerebellar function.   Skin: No  rash noted.  Psychiatric: He has a normal mood and affect. His behavior is normal.          Assessment & Plan:  #1 recent episode of near-syncope. Etiology unclear. Recent ED evaluation unremarkable. Set up echocardiogram and event monitor to further assess. He's not had any recent chest pains or exertional  dyspnea. Symptoms above did not sound likely to be related to seizure type activity. He has not had any actual loss of consciousness.  #3 unintentional weight loss. Recent labs reviewed. Add sedimentation rate and TSH. Consider CT abdomen and pelvis if symptoms persist. Three-week reassessment.  Eulas Post MD [facility] at [facility]

## 2016-06-20 ENCOUNTER — Ambulatory Visit (INDEPENDENT_AMBULATORY_CARE_PROVIDER_SITE_OTHER): Payer: Medicare Other

## 2016-06-20 DIAGNOSIS — R55 Syncope and collapse: Secondary | ICD-10-CM | POA: Diagnosis not present

## 2016-06-27 ENCOUNTER — Telehealth: Payer: Self-pay | Admitting: Family Medicine

## 2016-06-27 NOTE — Telephone Encounter (Signed)
I got this very late in the day - will forward to PCP for review as does not seem urgent.

## 2016-06-27 NOTE — Telephone Encounter (Signed)
Dr. Maudie Mercury - In Dr. Erick Blinks absence, can you provide advice on if the patient is safe to take Melatonin 5 mg?

## 2016-06-27 NOTE — Telephone Encounter (Signed)
The patient was wanting to know if he can take melatonin 5mg  with the medication that he is already taking. I have the patient that Dr. Elease Hashimoto is out of the office and that he would need to be the one to okay this medication.

## 2016-06-28 NOTE — Telephone Encounter (Signed)
OK 

## 2016-06-28 NOTE — Telephone Encounter (Signed)
I left a message for the pt to return my call. 

## 2016-07-02 DIAGNOSIS — M545 Low back pain: Secondary | ICD-10-CM | POA: Diagnosis not present

## 2016-07-04 ENCOUNTER — Other Ambulatory Visit (HOSPITAL_COMMUNITY): Payer: Medicare Other

## 2016-07-05 ENCOUNTER — Ambulatory Visit (HOSPITAL_COMMUNITY): Payer: Medicare Other | Attending: Cardiovascular Disease

## 2016-07-05 ENCOUNTER — Other Ambulatory Visit: Payer: Self-pay

## 2016-07-05 DIAGNOSIS — I119 Hypertensive heart disease without heart failure: Secondary | ICD-10-CM | POA: Insufficient documentation

## 2016-07-05 DIAGNOSIS — I059 Rheumatic mitral valve disease, unspecified: Secondary | ICD-10-CM | POA: Insufficient documentation

## 2016-07-05 DIAGNOSIS — R0600 Dyspnea, unspecified: Secondary | ICD-10-CM | POA: Diagnosis not present

## 2016-07-05 DIAGNOSIS — R55 Syncope and collapse: Secondary | ICD-10-CM | POA: Diagnosis not present

## 2016-07-05 DIAGNOSIS — I351 Nonrheumatic aortic (valve) insufficiency: Secondary | ICD-10-CM | POA: Diagnosis not present

## 2016-07-05 DIAGNOSIS — Z8249 Family history of ischemic heart disease and other diseases of the circulatory system: Secondary | ICD-10-CM | POA: Insufficient documentation

## 2016-07-05 DIAGNOSIS — E785 Hyperlipidemia, unspecified: Secondary | ICD-10-CM | POA: Diagnosis not present

## 2016-07-08 ENCOUNTER — Ambulatory Visit (INDEPENDENT_AMBULATORY_CARE_PROVIDER_SITE_OTHER): Payer: Medicare Other | Admitting: Family Medicine

## 2016-07-08 VITALS — BP 110/70 | Temp 98.3°F | Ht 71.0 in | Wt 153.6 lb

## 2016-07-08 DIAGNOSIS — R55 Syncope and collapse: Secondary | ICD-10-CM

## 2016-07-08 DIAGNOSIS — R634 Abnormal weight loss: Secondary | ICD-10-CM

## 2016-07-08 NOTE — Patient Instructions (Signed)
Consider supplement such as Ensure Make sure you eat at least 3 meals per day.

## 2016-07-08 NOTE — Telephone Encounter (Signed)
Patient was seen by Dr Burchette 

## 2016-07-08 NOTE — Progress Notes (Signed)
Subjective:     Patient ID: JAYKWON BORDNER, male   DOB: March 06, 1941, 75 y.o.   MRN: [id number]  HPI Patient  here regarding recent presyncopal type episode. Refer to recent note. He had fairly extensive workup in the ER which was unrevealing. Echocardiogram that we set up was unremarkable. Event monitor showed no significant arrhythmias. He's had no episodes since then.  Unintentional weight loss. Sedimentation rate was 13. TSH normal. Other recent labs normal. Chest x-ray unremarkable. Today comes back with wife and both have made some significant dietary changes. Her son with type 2 diabetes jusst recent started living with them they have scaled back on carbs because of that. Appetite is fair. No abdominal pain. No headaches. No chest pains.  Past Medical History:  Diagnosis Date  . Cerebrovascular accident ([place])   . Colon polyps   . Hyperlipidemia   . Hypertension   . Prediabetes    Past Surgical History:  Procedure Laterality Date  . APPENDECTOMY    . [place]    reports that he has never smoked. He has never used smokeless tobacco. He reports that he does not drink alcohol or use drugs. family history includes Diabetes in his mother; Heart disease in his father. No Known Allergies   Review of Systems  Constitutional: Positive for unexpected weight change. Negative for fatigue.  Eyes: Negative for visual disturbance.  Respiratory: Negative for cough, chest tightness and shortness of breath.   Cardiovascular: Negative for chest pain, palpitations and leg swelling.  Neurological: Negative for dizziness, syncope, weakness, light-headedness and headaches.       Objective:   Physical Exam  Constitutional: He is oriented to person, place, and time. He appears well-developed and well-nourished.  HENT:  Right Ear: External ear normal.  Left Ear: External ear normal.  Mouth/Throat: Oropharynx is clear and moist.  Eyes: Pupils are equal, round, and reactive to  light.  Neck: Neck supple. No thyromegaly present.  Cardiovascular: Normal rate and regular rhythm.   Pulmonary/Chest: Effort normal and breath sounds normal. No respiratory distress. He has no wheezes. He has no rales.  Musculoskeletal: He exhibits no edema.  Neurological: He is alert and oriented to person, place, and time.       Assessment:     #1 recent presyncopal episode. Etiology unclear. No further episodes. Workup as above unrevealing with echo and event monitor  #2 unintentional weight loss. He is down 1 more pound today from last visit. Recent labs been reassuring. Suspect related to recent dietary changes    Plan:     -Consider supplements such as ensure. -Liberalize calorie intake somewhat. -Recommend eating at least 3 meals per days he occasionally skips -Repeat weight in one month if not improved at point CT abdomen pelvis    Eulas Post MD [facility] at [facility]

## 2016-07-08 NOTE — Progress Notes (Signed)
Pre visit review using our clinic review tool, if applicable. No additional management support is needed unless otherwise documented below in the visit note. 

## 2016-07-11 ENCOUNTER — Encounter: Payer: Self-pay | Admitting: Podiatry

## 2016-07-11 ENCOUNTER — Ambulatory Visit (INDEPENDENT_AMBULATORY_CARE_PROVIDER_SITE_OTHER): Payer: Medicare Other | Admitting: Podiatry

## 2016-07-11 DIAGNOSIS — M1000 Idiopathic gout, unspecified site: Secondary | ICD-10-CM | POA: Diagnosis not present

## 2016-07-11 DIAGNOSIS — M779 Enthesopathy, unspecified: Secondary | ICD-10-CM | POA: Diagnosis not present

## 2016-07-11 NOTE — Progress Notes (Signed)
Subjective:     Patient ID: Joseph Mcintyre, male   DOB: 03-Jul-1941, 75 y.o.   MRN: [id number]  HPI patient states my left foot feels quite a bit better and it seems to have reduce the swelling and pain   Review of Systems     Objective:   Physical Exam Neurovascular status intact with significant diminishment of inflammation around the first MPJ left with diminishment of fluid and discomfort    Assessment:     Inflammatory capsulitis with probable gout improved left    Plan:     Reviewed with him the cause and also the structural bunion and the possibility that this may have to be corrected some day. Patient will be seen back as needed

## 2016-07-16 HISTORY — PX: OTHER SURGICAL HISTORY: SHX169

## 2016-08-05 ENCOUNTER — Emergency Department (HOSPITAL_COMMUNITY)
Admission: EM | Admit: 2016-08-05 | Discharge: 2016-08-06 | Disposition: A | Discharge status: Home / Self Care | Payer: Medicare Other | Attending: Emergency Medicine | Admitting: Emergency Medicine

## 2016-08-05 ENCOUNTER — Ambulatory Visit (INDEPENDENT_AMBULATORY_CARE_PROVIDER_SITE_OTHER): Payer: Medicare Other | Admitting: Family Medicine

## 2016-08-05 ENCOUNTER — Encounter (HOSPITAL_COMMUNITY): Payer: Self-pay | Admitting: Nurse Practitioner

## 2016-08-05 ENCOUNTER — Encounter: Payer: Self-pay | Admitting: Family Medicine

## 2016-08-05 ENCOUNTER — Emergency Department (HOSPITAL_COMMUNITY): Payer: Medicare Other

## 2016-08-05 VITALS — BP 110/78 | HR 73 | Temp 98.0°F | Ht 71.0 in | Wt 155.6 lb

## 2016-08-05 DIAGNOSIS — I10 Essential (primary) hypertension: Secondary | ICD-10-CM | POA: Diagnosis not present

## 2016-08-05 DIAGNOSIS — Y929 Unspecified place or not applicable: Secondary | ICD-10-CM | POA: Diagnosis not present

## 2016-08-05 DIAGNOSIS — Y939 Activity, unspecified: Secondary | ICD-10-CM | POA: Diagnosis not present

## 2016-08-05 DIAGNOSIS — M79632 Pain in left forearm: Secondary | ICD-10-CM | POA: Diagnosis not present

## 2016-08-05 DIAGNOSIS — W260XXA Contact with knife, initial encounter: Secondary | ICD-10-CM | POA: Insufficient documentation

## 2016-08-05 DIAGNOSIS — Z23 Encounter for immunization: Secondary | ICD-10-CM | POA: Insufficient documentation

## 2016-08-05 DIAGNOSIS — Z79899 Other long term (current) drug therapy: Secondary | ICD-10-CM | POA: Insufficient documentation

## 2016-08-05 DIAGNOSIS — S51812A Laceration without foreign body of left forearm, initial encounter: Secondary | ICD-10-CM | POA: Insufficient documentation

## 2016-08-05 DIAGNOSIS — S41112A Laceration without foreign body of left upper arm, initial encounter: Secondary | ICD-10-CM

## 2016-08-05 DIAGNOSIS — Y999 Unspecified external cause status: Secondary | ICD-10-CM | POA: Diagnosis not present

## 2016-08-05 DIAGNOSIS — R634 Abnormal weight loss: Secondary | ICD-10-CM | POA: Diagnosis not present

## 2016-08-05 MED ORDER — TETANUS-DIPHTH-ACELL PERTUSSIS 5-2.5-18.5 LF-MCG/0.5 IM SUSP
0.5000 mL | Freq: Once | INTRAMUSCULAR | Status: AC
  Administered 2016-08-05: 0.5 mL via INTRAMUSCULAR
  Filled 2016-08-05: qty 0.5

## 2016-08-05 MED ORDER — LIDOCAINE-EPINEPHRINE (PF) 2 %-1:200000 IJ SOLN
10.0000 mL | Freq: Once | INTRAMUSCULAR | Status: AC
  Administered 2016-08-05: 10 mL
  Filled 2016-08-05: qty 20

## 2016-08-05 NOTE — Progress Notes (Signed)
Subjective:     Patient ID: Joseph Mcintyre, male   DOB: 06-29-1941, 75 y.o.   MRN: [id number]  HPI Patient seen for follow-up regarding recent weight loss. Refer to recent note. He had recent epidural injections in back and he felt steroids had led to decreased appetite. We obtained several labs which were unremarkable. His appetite is improving and weight is back up 2 pounds today. He denies any depression. Denies any headaches, abdominal pain, chest pain, stool changes, fever, chills, night sweats Has been taking Ensure supplement and eating 3 meals regularly  Past Medical History:  Diagnosis Date  . Cerebrovascular accident ([place])   . Colon polyps   . Hyperlipidemia   . Hypertension   . Prediabetes    Past Surgical History:  Procedure Laterality Date  . APPENDECTOMY    . [place]    reports that he has never smoked. He has never used smokeless tobacco. He reports that he does not drink alcohol or use drugs. family history includes Diabetes in his mother; Heart disease in his father. No Known Allergies   Review of Systems  Constitutional: Negative for chills, fatigue and fever.  HENT: Negative for trouble swallowing.   Cardiovascular: Negative for chest pain.  Gastrointestinal: Negative for abdominal pain, diarrhea, nausea and vomiting.  Genitourinary: Negative for dysuria.  Neurological: Negative for headaches.  Hematological: Negative for adenopathy.  Psychiatric/Behavioral: Negative for dysphoric mood.       Objective:   Physical Exam  Constitutional: He appears well-developed and well-nourished.  HENT:  Mouth/Throat: Oropharynx is clear and moist.  Neck: Neck supple. No thyromegaly present.  Cardiovascular: Normal rate and regular rhythm.   Pulmonary/Chest: Effort normal and breath sounds normal. No respiratory distress. He has no wheezes. He has no rales.  Abdominal: Soft. There is no tenderness.  Musculoskeletal: He exhibits no edema.   Lymphadenopathy:    He has no cervical adenopathy.       Assessment:     Recent weight loss.-Stabilized with 2 pound weight gain now since last visit and appetite improving    Plan:     -No further evaluation at this point. -Set up Medicare wellness visit  Joseph Post MD [facility] at [facility]

## 2016-08-05 NOTE — ED Notes (Signed)
Pt transported to xray 

## 2016-08-05 NOTE — Progress Notes (Signed)
Pre visit review using our clinic review tool, if applicable. No additional management support is needed unless otherwise documented below in the visit note. 
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
## 2016-08-06 DIAGNOSIS — S51812A Laceration without foreign body of left forearm, initial encounter: Secondary | ICD-10-CM | POA: Diagnosis not present

## 2016-08-06 MED ORDER — CEPHALEXIN 500 MG PO CAPS: 500.0000 mg | ORAL_CAPSULE | Freq: Four times a day (QID) | ORAL | 0 refills | Status: DC

## 2016-08-06 NOTE — ED Notes (Signed)
Provider at the bedside.  

## 2016-08-06 NOTE — Progress Notes (Signed)
Orthopedic Tech Progress Note Patient Details:  Joseph Mcintyre 1941/10/11 [id number]  Ortho Devices Type of Ortho Device: Short arm splint Ortho Device/Splint Location: lue Ortho Device/Splint Interventions: Ordered, Application Applied long short arm splint as per drs verbal order  Karolee Stamps 08/06/2016, 2:21 AM

## 2016-08-06 NOTE — Discharge Instructions (Signed)
1. Medications: Tylenol or ibuprofen for pain, usual home medications 2. Treatment: ice for swelling, keep wound clean with warm soap and water and keep bandage dry, do not submerge in water for 24 hours 3. Follow Up: Please see Dr. Caralyn Guile on Thursday. Return to the emergency department for increased redness, drainage of pus from the wound   WOUND CARE  Keep area clean and dry for 24 hours. Do not remove bandage, if applied.  After 24 hours, remove bandage and wash wound gently with mild soap and warm water. Reapply a new bandage after cleaning wound, if directed.   Continue daily cleansing with soap and water until stitches/staples are removed.  Do not apply any ointments or creams to the wound while stitches/staples are in place, as this may cause delayed healing. Return if you experience any of the following signs of infection: Swelling, redness, pus drainage, streaking, fever >101.0 F  Return if you experience excessive bleeding that does not stop after 15-20 minutes of constant, firm pressure.

## 2016-08-08 DIAGNOSIS — S56522A Laceration of other extensor muscle, fascia and tendon at forearm level, left arm, initial encounter: Secondary | ICD-10-CM | POA: Diagnosis not present

## 2016-08-08 DIAGNOSIS — S51812A Laceration without foreign body of left forearm, initial encounter: Secondary | ICD-10-CM | POA: Diagnosis not present

## 2016-08-08 DIAGNOSIS — Y999 Unspecified external cause status: Secondary | ICD-10-CM | POA: Diagnosis not present

## 2016-08-12 DIAGNOSIS — M4316 Spondylolisthesis, lumbar region: Secondary | ICD-10-CM | POA: Diagnosis not present

## 2016-08-12 DIAGNOSIS — M4806 Spinal stenosis, lumbar region: Secondary | ICD-10-CM | POA: Diagnosis not present

## 2016-08-12 DIAGNOSIS — S32050A Wedge compression fracture of fifth lumbar vertebra, initial encounter for closed fracture: Secondary | ICD-10-CM | POA: Diagnosis not present

## 2016-08-15 DIAGNOSIS — S32050A Wedge compression fracture of fifth lumbar vertebra, initial encounter for closed fracture: Secondary | ICD-10-CM | POA: Diagnosis not present

## 2016-08-15 DIAGNOSIS — M4806 Spinal stenosis, lumbar region: Secondary | ICD-10-CM | POA: Diagnosis not present

## 2016-08-20 DIAGNOSIS — Z4789 Encounter for other orthopedic aftercare: Secondary | ICD-10-CM | POA: Diagnosis not present

## 2016-08-20 DIAGNOSIS — S51812D Laceration without foreign body of left forearm, subsequent encounter: Secondary | ICD-10-CM | POA: Diagnosis not present

## 2016-08-26 DIAGNOSIS — M4316 Spondylolisthesis, lumbar region: Secondary | ICD-10-CM | POA: Diagnosis not present

## 2016-09-05 ENCOUNTER — Ambulatory Visit (INDEPENDENT_AMBULATORY_CARE_PROVIDER_SITE_OTHER): Payer: Medicare Other

## 2016-09-05 ENCOUNTER — Telehealth: Payer: Self-pay | Admitting: Family Medicine

## 2016-09-05 DIAGNOSIS — S51812D Laceration without foreign body of left forearm, subsequent encounter: Secondary | ICD-10-CM | POA: Diagnosis not present

## 2016-09-05 DIAGNOSIS — Z23 Encounter for immunization: Secondary | ICD-10-CM

## 2016-09-05 NOTE — Telephone Encounter (Signed)
Joseph Mcintyre came in saying he's out of Diclofenac. He usually sees Dr. Elease Hashimoto, but the day the Rx was given to him, he saw Dr. Sarajane Jews who prescribed it. He's completely out of the medication and the pill bottle says, "No Refills - Dr. Must Authorize." Please call Mr. Jafari regarding this if needed.  Pt's ph# [phone]  Thank you.

## 2016-09-05 NOTE — Telephone Encounter (Signed)
Medication is not currently listed on med list. You saw pt last on 08/05/2016 for weight loss. Please review.

## 2016-09-06 MED ORDER — DICLOFENAC SODIUM 75 MG PO TBEC: 75.0000 mg | DELAYED_RELEASE_TABLET | Freq: Two times a day (BID) | ORAL | 0 refills | Status: DC

## 2016-09-06 NOTE — Telephone Encounter (Signed)
Pt is aware of annotations. Medication sent to pharmacy.

## 2016-09-06 NOTE — Telephone Encounter (Signed)
I would prefer for him to not take this regularly secondary to age and risk of GI bleed May refill Diclofenac 75 mg po bid with food prn arthritis pain #60 but try to avoid regular use.

## 2016-09-19 DIAGNOSIS — S51812D Laceration without foreign body of left forearm, subsequent encounter: Secondary | ICD-10-CM | POA: Diagnosis not present

## 2016-09-19 DIAGNOSIS — Z4789 Encounter for other orthopedic aftercare: Secondary | ICD-10-CM | POA: Diagnosis not present

## 2016-09-23 ENCOUNTER — Other Ambulatory Visit: Payer: Self-pay | Admitting: Neurological Surgery

## 2016-09-23 DIAGNOSIS — S32050A Wedge compression fracture of fifth lumbar vertebra, initial encounter for closed fracture: Secondary | ICD-10-CM | POA: Diagnosis not present

## 2016-09-23 DIAGNOSIS — M4316 Spondylolisthesis, lumbar region: Secondary | ICD-10-CM | POA: Diagnosis not present

## 2016-09-23 DIAGNOSIS — M48062 Spinal stenosis, lumbar region with neurogenic claudication: Secondary | ICD-10-CM | POA: Diagnosis not present

## 2016-09-24 ENCOUNTER — Ambulatory Visit (INDEPENDENT_AMBULATORY_CARE_PROVIDER_SITE_OTHER): Payer: Medicare Other

## 2016-09-24 VITALS — BP 124/74 | Ht 72.0 in | Wt 158.0 lb

## 2016-09-24 DIAGNOSIS — Z0000 Encounter for general adult medical examination without abnormal findings: Secondary | ICD-10-CM
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
## 2016-09-24 NOTE — Patient Instructions (Addendum)
Joseph Mcintyre , Thank you for taking time to come for your Medicare Wellness Visit. I appreciate your ongoing commitment to your health goals. Please review the following plan we discussed and let me know if I can assist you in the future.   Will [facility] with [facility] regarding receipt of Prevnar; It takes 2 weeks to build antibodies CDC requires 2 pneumonia vaccines after 65; you have received the pnemonvax Check with the [facility] to see if they have given you a prevnar. If not, you can take it there or here. Try to take prior to your surgery (2 weeks if possible)   Will check with insurance regarding taken the shingles Educated to check with insurance regarding coverage of Shingles vaccination on Part D or Part B and may have lower co-pay if provided on the Part D side/ and check with [facility]   To check with the [place] as colonoscopy was due January 2017;  You can ask them about the colo-guard;   Will ask the [facility] about drawing LIPIDS for cholesterol Please send the office the results. Pre-diabetes is followed Educated regarding prediabetes and numbers;  A1c ranges from 5.8 to 6.5 or fasting Blood sugar > 115 -126; (126 is diabetic)   Risk: >45yo; family hx; overweight or obese; African American; Hispanic; Latino; American [place] American; [place]; history of diabetes when pregnant; or birth to a baby weighing over 9 lbs. Being less physically active than 30 minutes; 3 times a week;   Prevention; Losing a modest 7 to 8 lbs; If over 200 lbs; 10 to 14 lbs;  Choose healthier foods; colorful veggies; fish or lean meats; drinks water Reduce portion size Start exercising; 30 minutes of fast walking x 30 minutes per day/ 60 min for weight loss   Prevention of falls: Remove rugs or any tripping hazards in the home Use Non slip mats in bathtubs and showers Placing grab bars next to the toilet and or shower Placing handrails on both sides of the stair way Adding extra lighting in the home.    Personal safety issues reviewed:  1. Consider starting a community watch program per [facility] 2.  Changes batteries is smoke detector and/or carbon monoxide detector  3.  If you have firearms; keep them in a safe place 4.  Wear protection when in the sun; Always wear sunscreen or a hat; It is good to have your doctor check your skin annually or review any new areas of concern 5. Driving safety; Keep in the right lane; stay 3 car lengths behind the car in front of you on the highway; look 3 times prior to pulling out; carry your cell phone everywhere you go!   Learn about the Yellow Dot program:  The program allows first responders at your emergency to have access to who your physician is, as well as your medications and medical conditions.  Citizens requesting the Yellow Dot Packages should contact Master Corporal Nunzio Cobbs at the [facility] [phone] for the first week of the program and beginning the week after Easter citizens should contact their Scientist, physiological.    These are the goals we discussed: Goals    . patient          Keep walking and continue strength training;         This is a list of the screening recommended for you and due dates:  Health Maintenance  Topic Date Due  . Shingles Vaccine  01/08/2003  . Pneumonia vaccines (1 of 2 - PCV13) 12/16/2008  . Colon Cancer Screening  12/26/2015  . Tetanus Vaccine  08/05/2026  . Flu Shot  Completed       Fall Prevention in the Home  Falls can cause injuries. They can happen to people of all ages. There are many things you can do to make your home safe and to help prevent falls.  WHAT CAN I DO ON THE OUTSIDE OF MY HOME?  Regularly fix the edges of walkways and driveways and fix any cracks.  Remove anything that might make you trip as you walk through a door, such as a raised step or threshold.  Trim any bushes or trees on the path to your home.  Use bright  outdoor lighting.  Clear any walking paths of anything that might make someone trip, such as rocks or tools.  Regularly check to see if handrails are loose or broken. Make sure that both sides of any steps have handrails.  Any raised decks and porches should have guardrails on the edges.  Have any leaves, snow, or ice cleared regularly.  Use sand or salt on walking paths during winter.  Clean up any spills in your garage right away. This includes oil or grease spills. WHAT CAN I DO IN THE BATHROOM?   Use night lights.  Install grab bars by the toilet and in the tub and shower. Do not use towel bars as grab bars.  Use non-skid mats or decals in the tub or shower.  If you need to sit down in the shower, use a plastic, non-slip stool.  Keep the floor dry. Clean up any water that spills on the floor as soon as it happens.  Remove soap buildup in the tub or shower regularly.  Attach bath mats securely with double-sided non-slip rug tape.  Do not have throw rugs and other things on the floor that can make you trip. WHAT CAN I DO IN THE BEDROOM?  Use night lights.  Make sure that you have a light by your bed that is easy to reach.  Do not use any sheets or blankets that are too big for your bed. They should not hang down onto the floor.  Have a firm chair that has side arms. You can use this for support while you get dressed.  Do not have throw rugs and other things on the floor that can make you trip. WHAT CAN I DO IN THE KITCHEN?  Clean up any spills right away.  Avoid walking on wet floors.  Keep items that you use a lot in easy-to-reach places.  If you need to reach something above you, use a strong step stool that has a grab bar.  Keep electrical cords out of the way.  Do not use floor polish or wax that makes floors slippery. If you must use wax, use non-skid floor wax.  Do not have throw rugs and other things on the floor that can make you trip. WHAT CAN I DO  WITH MY STAIRS?  Do not leave any items on the stairs.  Make sure that there are handrails on both sides of the stairs and use them. Fix handrails that are broken or loose. Make sure that handrails are as long as the stairways.  Check any carpeting to make sure that it is firmly attached to the stairs. Fix any carpet that is loose or worn.  Avoid having throw rugs at the top or bottom of  the stairs. If you do have throw rugs, attach them to the floor with carpet tape.  Make sure that you have a light switch at the top of the stairs and the bottom of the stairs. If you do not have them, ask someone to add them for you. WHAT ELSE CAN I DO TO HELP PREVENT FALLS?  Wear shoes that:  Do not have high heels.  Have rubber bottoms.  Are comfortable and fit you well.  Are closed at the toe. Do not wear sandals.  If you use a stepladder:  Make sure that it is fully opened. Do not climb a closed stepladder.  Make sure that both sides of the stepladder are locked into place.  Ask someone to hold it for you, if possible.  Clearly mark and make sure that you can see:  Any grab bars or handrails.  First and last steps.  Where the edge of each step is.  Use tools that help you move around (mobility aids) if they are needed. These include:  Canes.  Walkers.  Scooters.  Crutches.  Turn on the lights when you go into a dark area. Replace any light bulbs as soon as they burn out.  Set up your furniture so you have a clear path. Avoid moving your furniture around.  If any of your floors are uneven, fix them.  If there are any pets around you, be aware of where they are.  Review your medicines with your doctor. Some medicines can make you feel dizzy. This can increase your chance of falling. Ask your doctor what other things that you can do to help prevent falls.   This information is not intended to replace advice given to you by your health care provider. Make sure you discuss  any questions you have with your health care provider.   Document Released: 09/28/2009 Document Revised: 04/18/2015 Document Reviewed: 01/06/2015 Elsevier Interactive Patient Education 2016 [place] Maintenance, Male A healthy lifestyle and preventative care can promote health and wellness.  Maintain regular health, dental, and eye exams.  Eat a healthy diet. Foods like vegetables, fruits, whole grains, low-fat dairy products, and lean protein foods contain the nutrients you need and are low in calories. Decrease your intake of foods high in solid fats, added sugars, and salt. Get information about a proper diet from your health care provider, if necessary.  Regular physical exercise is one of the most important things you can do for your health. Most adults should get at least 150 minutes of moderate-intensity exercise (any activity that increases your heart rate and causes you to sweat) each week. In addition, most adults need muscle-strengthening exercises on 2 or more days a week.   Maintain a healthy weight. The body mass index (BMI) is a screening tool to identify possible weight problems. It provides an estimate of body fat based on height and weight. Your health care provider can find your BMI and can help you achieve or maintain a healthy weight. For males 20 years and older:  A BMI below 18.5 is considered underweight.  A BMI of 18.5 to 24.9 is normal.  A BMI of 25 to 29.9 is considered overweight.  A BMI of 30 and above is considered obese.  Maintain normal blood lipids and cholesterol by exercising and minimizing your intake of saturated fat. Eat a balanced diet with plenty of fruits and vegetables. Blood tests for lipids and cholesterol should begin at age 65 and be repeated every  5 years. If your lipid or cholesterol levels are high, you are over age 42, or you are at high risk for heart disease, you may need your cholesterol levels checked more  frequently.Ongoing high lipid and cholesterol levels should be treated with medicines if diet and exercise are not working.  If you smoke, find out from your health care provider how to quit. If you do not use tobacco, do not start.  Lung cancer screening is recommended for adults aged 23-80 years who are at high risk for developing lung cancer because of a history of smoking. A yearly low-dose CT scan of the lungs is recommended for people who have at least a 30-pack-year history of smoking and are current smokers or have quit within the past 15 years. A pack year of smoking is smoking an average of 1 pack of cigarettes a day for 1 year (for example, a 30-pack-year history of smoking could mean smoking 1 pack a day for 30 years or 2 packs a day for 15 years). Yearly screening should continue until the smoker has stopped smoking for at least 15 years. Yearly screening should be stopped for people who develop a health problem that would prevent them from having lung cancer treatment.  If you choose to drink alcohol, do not have more than 2 drinks per day. One drink is considered to be 12 oz (360 mL) of beer, 5 oz (150 mL) of wine, or 1.5 oz (45 mL) of liquor.  Avoid the use of street drugs. Do not share needles with anyone. Ask for help if you need support or instructions about stopping the use of drugs.  High blood pressure causes heart disease and increases the risk of stroke. High blood pressure is more likely to develop in:  People who have blood pressure in the end of the normal range (100-139/85-89 mm Hg).  People who are overweight or obese.  People who are African American.  If you are 66-39 years of age, have your blood pressure checked every 3-5 years. If you are 2 years of age or older, have your blood pressure checked every year. You should have your blood pressure measured twice--once when you are at a hospital or clinic, and once when you are not at a hospital or clinic. Record the  average of the two measurements. To check your blood pressure when you are not at a hospital or clinic, you can use:  An automated blood pressure machine at a pharmacy.  A home blood pressure monitor.  If you are 80-61 years old, ask your health care provider if you should take aspirin to prevent heart disease.  Diabetes screening involves taking a blood sample to check your fasting blood sugar level. This should be done once every 3 years after age 34 if you are at a normal weight and without risk factors for diabetes. Testing should be considered at a younger age or be carried out more frequently if you are overweight and have at least 1 risk factor for diabetes.  Colorectal cancer can be detected and often prevented. Most routine colorectal cancer screening begins at the age of 75 and continues through age 6. However, your health care provider may recommend screening at an earlier age if you have risk factors for colon cancer. On a yearly basis, your health care provider may provide home test kits to check for hidden blood in the stool. A small camera at the end of a tube may be used to directly examine  the colon (sigmoidoscopy or colonoscopy) to detect the earliest forms of colorectal cancer. Talk to your health care provider about this at age 27 when routine screening begins. A direct exam of the colon should be repeated every 5-10 years through age 43, unless early forms of precancerous polyps or small growths are found.  People who are at an increased risk for hepatitis B should be screened for this virus. You are considered at high risk for hepatitis B if:  You were born in a country where hepatitis B occurs often. Talk with your health care provider about which countries are considered high risk.  Your parents were born in a high-risk country and you have not received a shot to protect against hepatitis B (hepatitis B vaccine).  You have HIV or AIDS.  You use needles to inject street  drugs.  You live with, or have sex with, someone who has hepatitis B.  You are a man who has sex with other men (MSM).  You get hemodialysis treatment.  You take certain medicines for conditions like cancer, organ transplantation, and autoimmune conditions.  Hepatitis C blood testing is recommended for all people born from [birth year] through 1963 and any individual with known risk factors for hepatitis C.  Healthy men should no longer receive prostate-specific antigen (PSA) blood tests as part of routine cancer screening. Talk to your health care provider about prostate cancer screening.  Testicular cancer screening is not recommended for adolescents or adult males who have no symptoms. Screening includes self-exam, a health care provider exam, and other screening tests. Consult with your health care provider about any symptoms you have or any concerns you have about testicular cancer.  Practice safe sex. Use condoms and avoid high-risk sexual practices to reduce the spread of sexually transmitted infections (STIs).  You should be screened for STIs, including gonorrhea and chlamydia if:  You are sexually active and are younger than 24 years.  You are older than 24 years, and your health care provider tells you that you are at risk for this type of infection.  Your sexual activity has changed since you were last screened, and you are at an increased risk for chlamydia or gonorrhea. Ask your health care provider if you are at risk.  If you are at risk of being infected with HIV, it is recommended that you take a prescription medicine daily to prevent HIV infection. This is called pre-exposure prophylaxis (PrEP). You are considered at risk if:  You are a man who has sex with other men (MSM).  You are a heterosexual man who is sexually active with multiple partners.  You take drugs by injection.  You are sexually active with a partner who has HIV.  Talk with your health care provider about  whether you are at high risk of being infected with HIV. If you choose to begin PrEP, you should first be tested for HIV. You should then be tested every 3 months for as long as you are taking PrEP.  Use sunscreen. Apply sunscreen liberally and repeatedly throughout the day. You should seek shade when your shadow is shorter than you. Protect yourself by wearing long sleeves, pants, a wide-brimmed hat, and sunglasses year round whenever you are outdoors.  Tell your health care provider of new moles or changes in moles, especially if there is a change in shape or color. Also, tell your health care provider if a mole is larger than the size of a pencil eraser.  A one-time  screening for abdominal aortic aneurysm (AAA) and surgical repair of large AAAs by ultrasound is recommended for men aged 93-75 years who are current or former smokers.  Stay current with your vaccines (immunizations).   This information is not intended to replace advice given to you by your health care provider. Make sure you discuss any questions you have with your health care provider.   Document Released: 05/30/2008 Document Revised: 12/23/2014 Document Reviewed: 04/29/2011 Elsevier Interactive Patient Education 2016 [employer].   Hearing Loss Hearing loss is a partial or total loss of the ability to hear. This can be temporary or permanent, and it can happen in one or both ears. Hearing loss may be referred to as deafness. Medical care is necessary to treat hearing loss properly and to prevent the condition from getting worse. Your hearing may partially or completely come back, depending on what caused your hearing loss and how severe it is. In some cases, hearing loss is permanent. CAUSES Common causes of hearing loss include:   Too much wax in the ear canal.   Infection of the ear canal or middle ear.   Fluid in the middle ear.   Injury to the ear or surrounding area.   An object stuck in the ear.    Prolonged exposure to loud sounds, such as music.  Less common causes of hearing loss include:   Tumors in the ear.   Viral or bacterial infections, such as meningitis.   A hole in the eardrum (perforated eardrum).  Problems with the hearing nerve that sends signals between the brain and the ear.  Certain medicines.  SYMPTOMS  Symptoms of this condition may include:  Difficulty telling the difference between sounds.  Difficulty following a conversation when there is background noise.  Lack of response to sounds in your environment. This may be most noticeable when you do not respond to startling sounds.  Needing to turn up the volume on the television, radio, etc.  Ringing in the ears.  Dizziness.  Pain in the ears. DIAGNOSIS This condition is diagnosed based on a physical exam and a hearing test (audiometry). The audiometry test will be performed by a hearing specialist (audiologist). You may also be referred to an ear, nose, and throat (ENT) specialist (otolaryngologist).  TREATMENT Treatment for recent onset of hearing loss may include:   Ear wax removal.   Being prescribed medicines to prevent infection (antibiotics).   Being prescribed medicines to reduce inflammation (corticosteroids).  HOME CARE INSTRUCTIONS  If you were prescribed an antibiotic medicine, take it as told by your health care provider. Do not stop taking the antibiotic even if you start to feel better.  Take over-the-counter and prescription medicines only as told by your health care provider.  Avoid loud noises.   Return to your normal activities as told by your health care provider. Ask your health care provider what activities are safe for you.  Keep all follow-up visits as told by your health care provider. This is important. SEEK MEDICAL CARE IF:   You feel dizzy.   You develop new symptoms.   You vomit or feel nauseous.   You have a fever.  SEEK IMMEDIATE MEDICAL  CARE IF:  You develop sudden changes in your vision.   You have severe ear pain.   You have new or increased weakness.  You have a severe headache.   This information is not intended to replace advice given to you by your health care provider. Make sure  you discuss any questions you have with your health care provider.   Document Released: 12/02/2005 Document Revised: 08/23/2015 Document Reviewed: 04/19/2015 Elsevier Interactive Patient Education [employer].

## 2016-09-27 ENCOUNTER — Ambulatory Visit: Payer: Medicare Other

## 2016-10-03 DIAGNOSIS — S51812D Laceration without foreign body of left forearm, subsequent encounter: Secondary | ICD-10-CM | POA: Diagnosis not present

## 2016-10-17 DIAGNOSIS — Z4789 Encounter for other orthopedic aftercare: Secondary | ICD-10-CM | POA: Diagnosis not present

## 2016-10-17 DIAGNOSIS — S51812D Laceration without foreign body of left forearm, subsequent encounter: Secondary | ICD-10-CM | POA: Diagnosis not present

## 2016-10-22 ENCOUNTER — Other Ambulatory Visit (HOSPITAL_COMMUNITY): Payer: Medicare Other

## 2016-10-25 ENCOUNTER — Encounter (HOSPITAL_COMMUNITY): Payer: Self-pay

## 2016-10-25 ENCOUNTER — Encounter (HOSPITAL_COMMUNITY)
Admission: RE | Admit: 2016-10-25 | Discharge: 2016-10-25 | Disposition: A | Discharge status: Home / Self Care | Payer: Medicare Other | Source: Ambulatory Visit | Attending: Neurological Surgery | Admitting: Neurological Surgery

## 2016-10-25 DIAGNOSIS — M4856XA Collapsed vertebra, not elsewhere classified, lumbar region, initial encounter for fracture: Secondary | ICD-10-CM | POA: Diagnosis not present

## 2016-10-25 DIAGNOSIS — Z01812 Encounter for preprocedural laboratory examination: Secondary | ICD-10-CM | POA: Insufficient documentation

## 2016-10-25 HISTORY — DX: Unspecified osteoarthritis, unspecified site: M19.90

## 2016-10-25 HISTORY — DX: Anxiety disorder, unspecified: F41.9

## 2016-10-25 LAB — SURGICAL PCR SCREEN
MRSA, PCR: NEGATIVE
STAPHYLOCOCCUS AUREUS: NEGATIVE

## 2016-10-25 NOTE — Pre-Procedure Instructions (Addendum)
    ARIQ VANDUSER  10/25/2016     Your procedure is scheduled on   Wednesday, November 29.  [facility]                   Your surgery or procedure is scheduled for 3:00 PM   Call this number if you have problems the morning of [phone]                   For any other questions, please call [phone], Monday - Friday 8 AM - 4 PM.   Remember:  Do not eat food or drink liquids after midnight Tuesday, November 28  Take these medicines the morning of surgery with A SIP OF WATER: atenolol (TENORMIN).                 May take LORazepam (ATIVAN).               Stop taking Aspirin, Aspirin Products, Herbal Products, Vitamins.  Do not take any NSAIDS ie:  Ibuprofen, Advil, Naproxen.   Do not wear jewelry, make-up or nail polish.  Do not wear lotions, powders, or perfumes, or deodorant.             Men may shave face and neck.  Do not bring valuables to the hospital.  [facility] is not responsible for any belongings or valuables.  Contacts, dentures or bridgework may not be worn into surgery.  Leave your suitcase in the car.  After surgery it may be brought to your room.  For patients admitted to the hospital, discharge time will be determined by your treatment team.  Patients discharged the day of surgery will not be allowed to drive home.   Special instructions: Review  [facility] - Preparing For Surgery.  Please read over the following fact sheets that you were given. [facility]- Preparing For Surgery and Patient Instructions for Mupirocin Application, Coughing and Deep Breathing, Pain Booklet

## 2016-11-13 ENCOUNTER — Inpatient Hospital Stay (HOSPITAL_COMMUNITY): Admission: RE | Admit: 2016-11-13 | Payer: Medicare Other | Source: Ambulatory Visit | Admitting: Neurological Surgery

## 2016-11-13 ENCOUNTER — Encounter (HOSPITAL_COMMUNITY): Admission: RE | Payer: Self-pay | Source: Ambulatory Visit

## 2016-11-13 SURGERY — LAMINECTOMY WITH POSTERIOR LATERAL ARTHRODESIS LEVEL 2
Anesthesia: General | Site: Back

## 2016-11-25 DIAGNOSIS — H35371 Puckering of macula, right eye: Secondary | ICD-10-CM | POA: Diagnosis not present

## 2017-06-04 DIAGNOSIS — H35371 Puckering of macula, right eye: Secondary | ICD-10-CM | POA: Diagnosis not present

## 2017-06-04 DIAGNOSIS — H0231 Blepharochalasis right upper eyelid: Secondary | ICD-10-CM | POA: Diagnosis not present

## 2017-06-04 DIAGNOSIS — H0234 Blepharochalasis left upper eyelid: Secondary | ICD-10-CM | POA: Diagnosis not present

## 2017-06-07 IMAGING — DX DG CHEST 2V
2 series · 2 of 2 positions shown · non-contrast
Comparison: None.

CLINICAL DATA: Shortness of Breath

EXAM:
CHEST  2 VIEW

[chest pa]
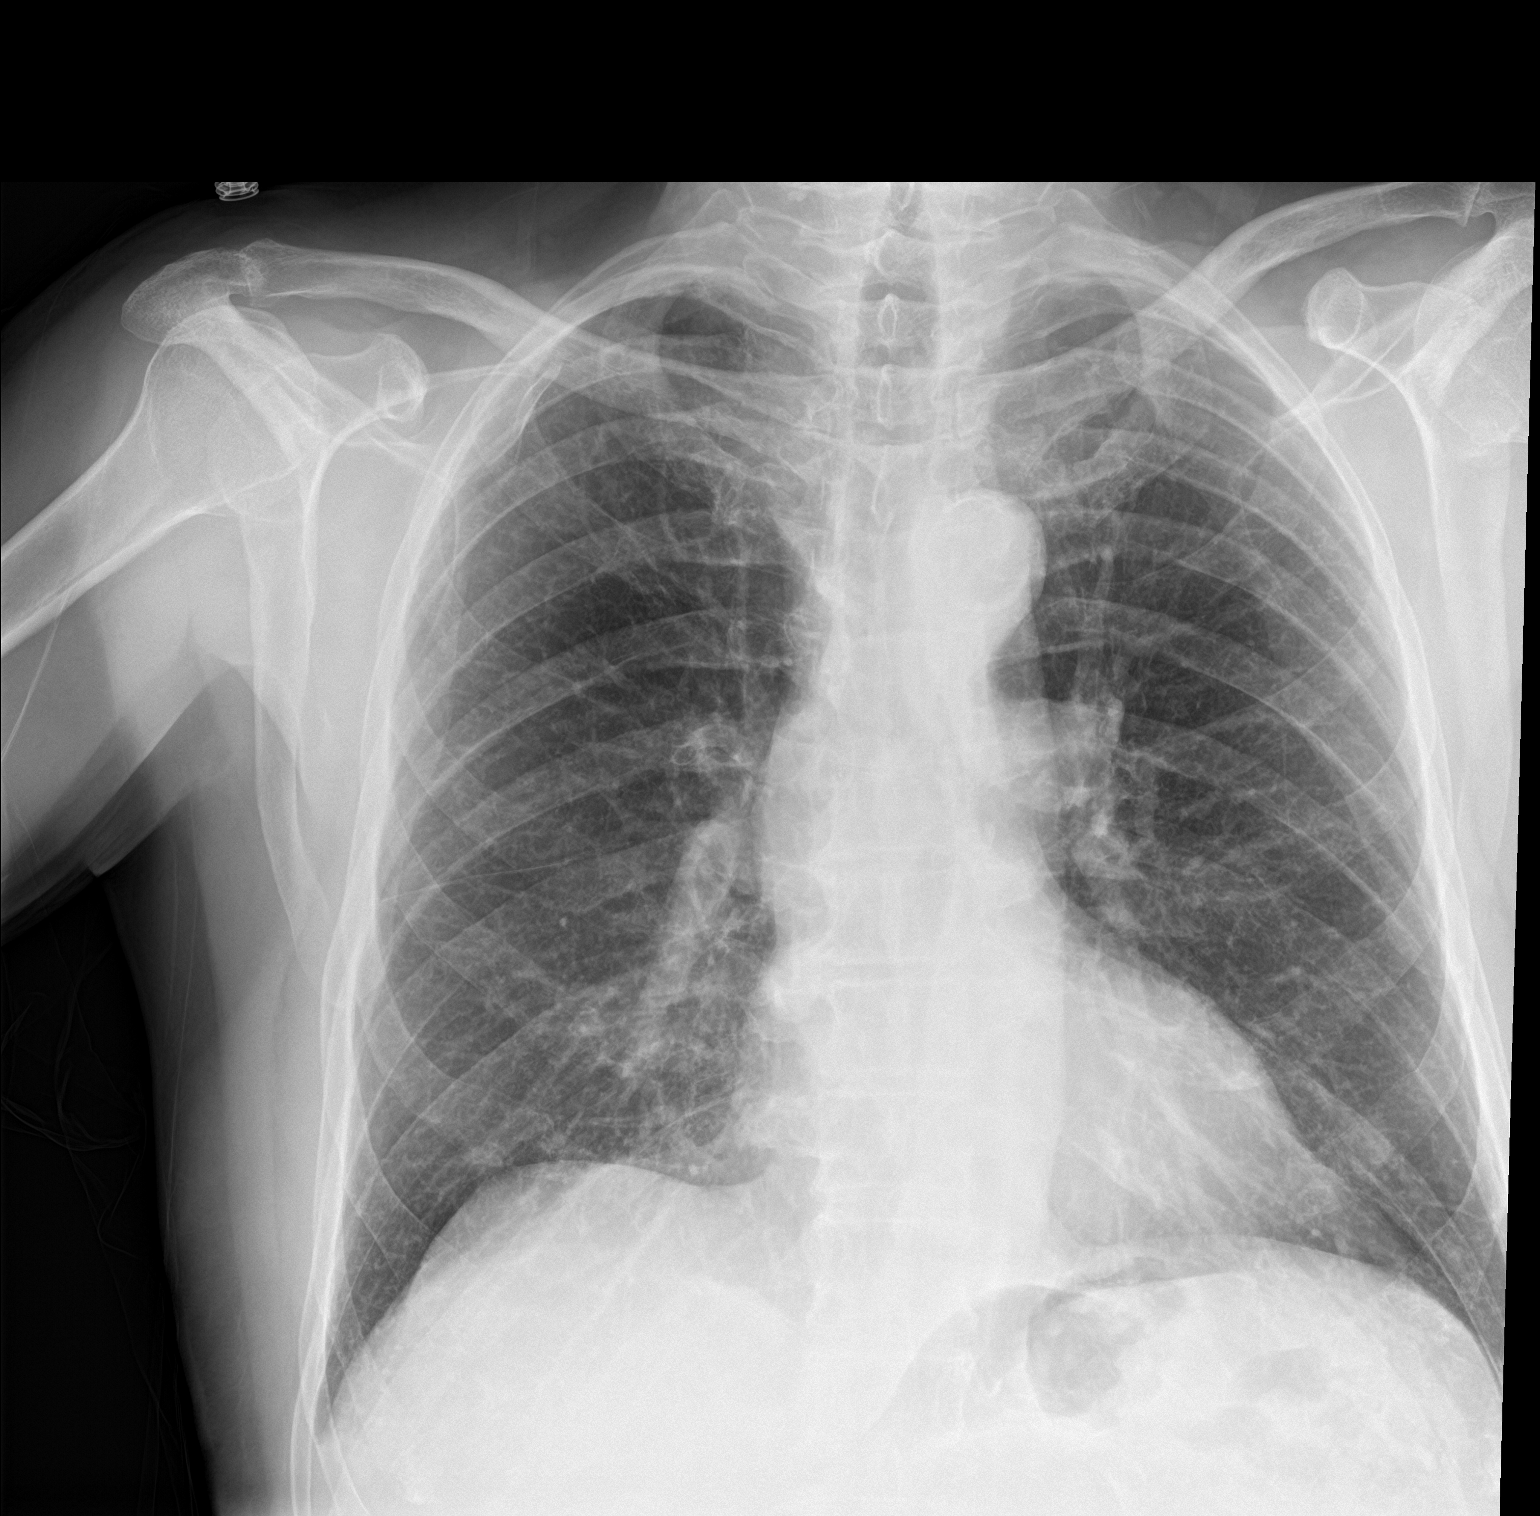

[chest lat]
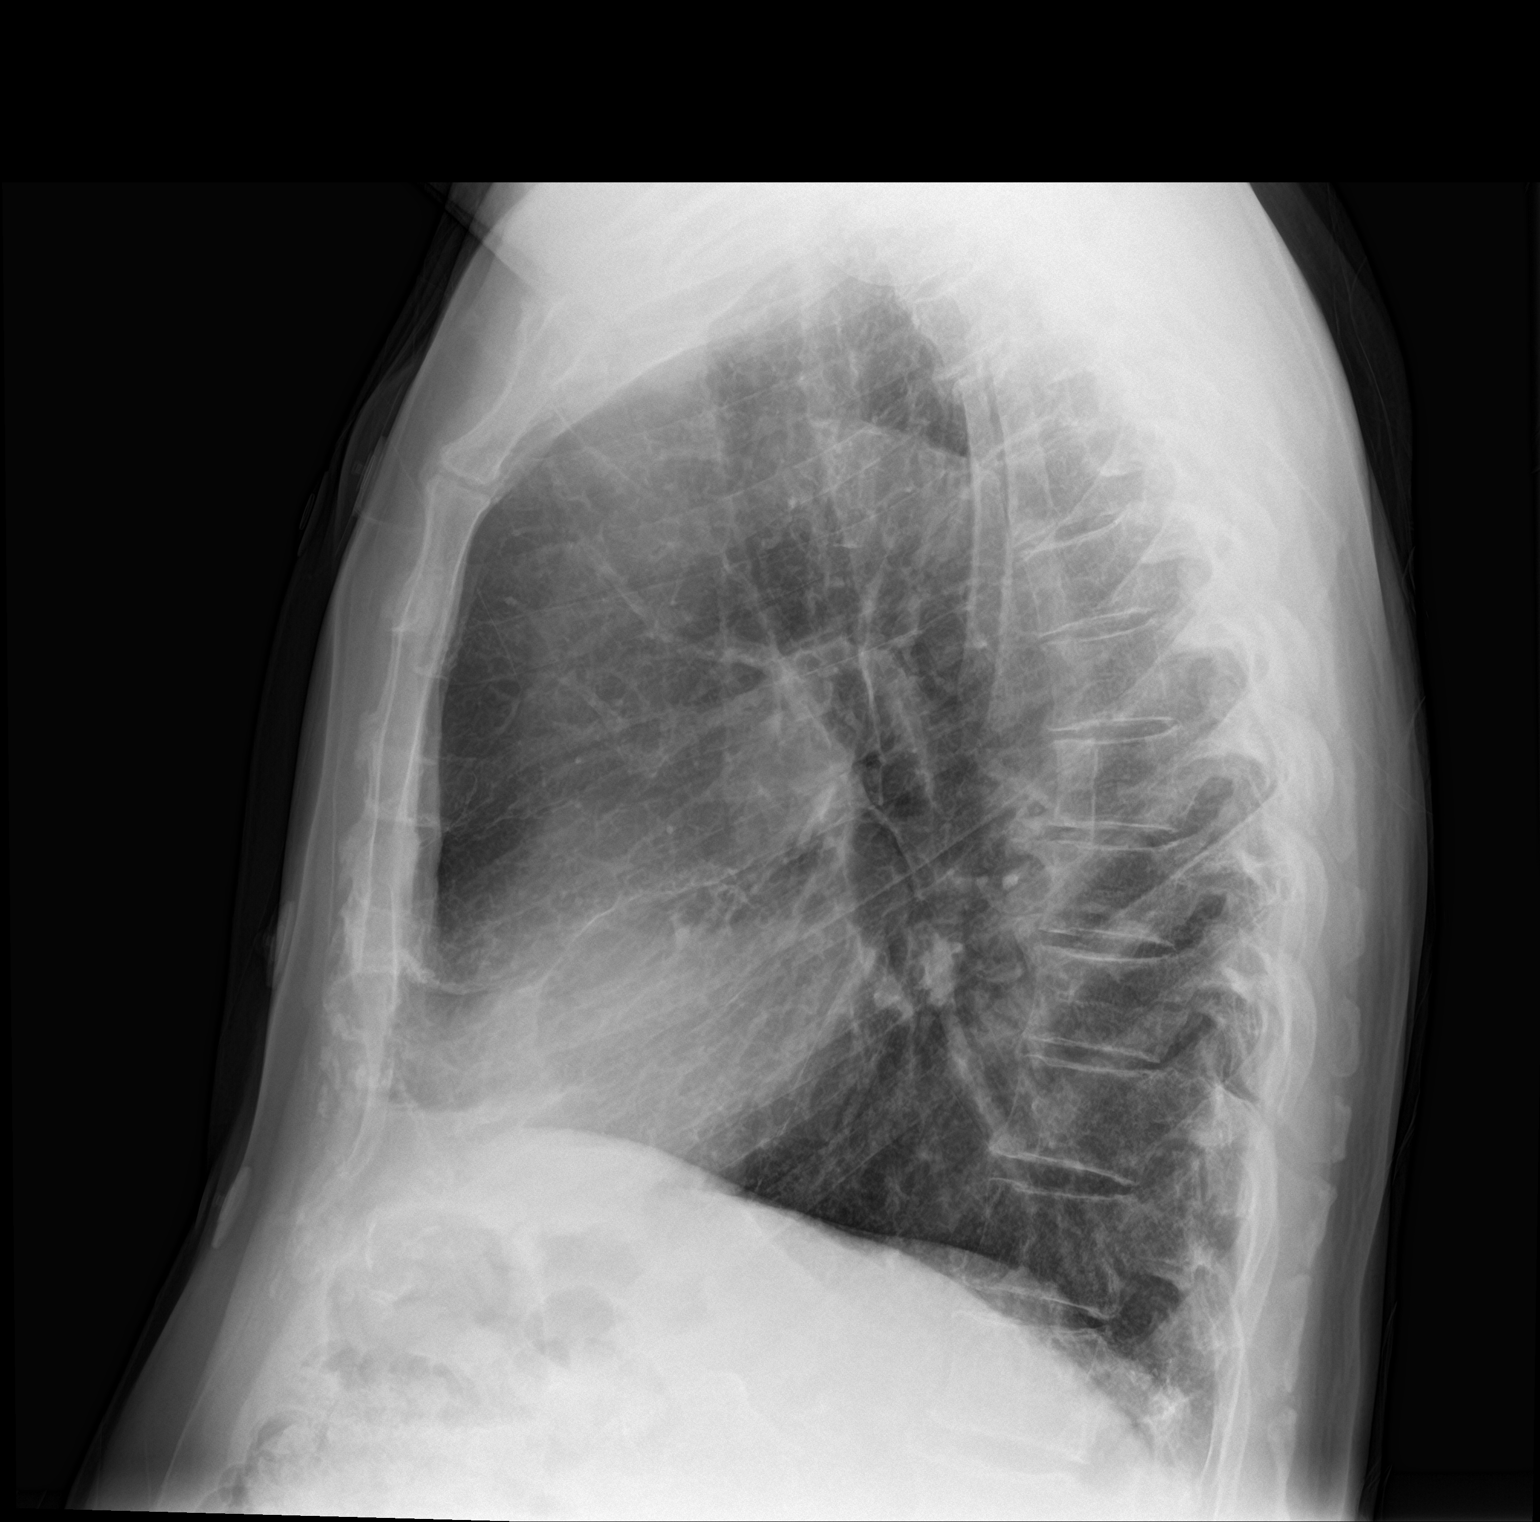

[2 of 2 positions shown; findings below may reference images not displayed]

FINDINGS: There is no edema or consolidation. The heart size and pulmonary
vascularity are normal. There is atherosclerotic calcification in
the aorta. No adenopathy. No bone lesions. There is calcification in
the carotid arteries bilaterally.
IMPRESSION: No edema or consolidation. Aortic atherosclerosis. Foci of carotid
artery calcification bilaterally also noted.

## 2018-03-30 DIAGNOSIS — L578 Other skin changes due to chronic exposure to nonionizing radiation: Secondary | ICD-10-CM | POA: Diagnosis not present

## 2018-03-30 DIAGNOSIS — L814 Other melanin hyperpigmentation: Secondary | ICD-10-CM | POA: Diagnosis not present

## 2018-03-30 DIAGNOSIS — D225 Melanocytic nevi of trunk: Secondary | ICD-10-CM | POA: Diagnosis not present

## 2018-03-30 DIAGNOSIS — L218 Other seborrheic dermatitis: Secondary | ICD-10-CM | POA: Diagnosis not present

## 2018-03-30 DIAGNOSIS — D1801 Hemangioma of skin and subcutaneous tissue: Secondary | ICD-10-CM | POA: Diagnosis not present

## 2018-03-30 DIAGNOSIS — L570 Actinic keratosis: Secondary | ICD-10-CM | POA: Diagnosis not present

## 2018-05-18 DIAGNOSIS — L570 Actinic keratosis: Secondary | ICD-10-CM | POA: Diagnosis not present

## 2018-06-10 DIAGNOSIS — H0234 Blepharochalasis left upper eyelid: Secondary | ICD-10-CM | POA: Diagnosis not present

## 2018-06-10 DIAGNOSIS — Z961 Presence of intraocular lens: Secondary | ICD-10-CM | POA: Diagnosis not present

## 2018-06-10 DIAGNOSIS — H0231 Blepharochalasis right upper eyelid: Secondary | ICD-10-CM | POA: Diagnosis not present

## 2018-06-10 DIAGNOSIS — H35371 Puckering of macula, right eye: Secondary | ICD-10-CM | POA: Diagnosis not present

## 2018-07-30 DIAGNOSIS — L814 Other melanin hyperpigmentation: Secondary | ICD-10-CM | POA: Diagnosis not present

## 2018-07-30 DIAGNOSIS — L570 Actinic keratosis: Secondary | ICD-10-CM | POA: Diagnosis not present

## 2018-07-30 DIAGNOSIS — L821 Other seborrheic keratosis: Secondary | ICD-10-CM | POA: Diagnosis not present

## 2018-11-17 DIAGNOSIS — L821 Other seborrheic keratosis: Secondary | ICD-10-CM | POA: Diagnosis not present

## 2018-11-17 DIAGNOSIS — D225 Melanocytic nevi of trunk: Secondary | ICD-10-CM | POA: Diagnosis not present

## 2018-11-17 DIAGNOSIS — L820 Inflamed seborrheic keratosis: Secondary | ICD-10-CM | POA: Diagnosis not present

## 2018-11-17 DIAGNOSIS — L218 Other seborrheic dermatitis: Secondary | ICD-10-CM | POA: Diagnosis not present

## 2018-11-17 DIAGNOSIS — L570 Actinic keratosis: Secondary | ICD-10-CM | POA: Diagnosis not present

## 2018-11-17 DIAGNOSIS — D1801 Hemangioma of skin and subcutaneous tissue: Secondary | ICD-10-CM | POA: Diagnosis not present

## 2018-11-17 DIAGNOSIS — L814 Other melanin hyperpigmentation: Secondary | ICD-10-CM | POA: Diagnosis not present

## 2019-01-04 ENCOUNTER — Encounter: Payer: Self-pay | Admitting: Family Medicine

## 2019-01-04 ENCOUNTER — Ambulatory Visit (INDEPENDENT_AMBULATORY_CARE_PROVIDER_SITE_OTHER): Payer: Medicare Other | Admitting: Family Medicine

## 2019-01-04 ENCOUNTER — Other Ambulatory Visit: Payer: Self-pay

## 2019-01-04 VITALS — BP 148/98 | HR 57 | Temp 97.9°F | Ht 69.0 in | Wt 169.1 lb

## 2019-01-04 DIAGNOSIS — I10 Essential (primary) hypertension: Secondary | ICD-10-CM

## 2019-01-04 DIAGNOSIS — R519 Headache, unspecified: Secondary | ICD-10-CM

## 2019-01-04 DIAGNOSIS — R51 Headache: Secondary | ICD-10-CM | POA: Diagnosis not present

## 2019-01-04 DIAGNOSIS — Z8679 Personal history of other diseases of the circulatory system: Secondary | ICD-10-CM

## 2019-01-04 MED ORDER — AMLODIPINE BESYLATE 5 MG PO TABS: 5.0000 mg | ORAL_TABLET | Freq: Every day | ORAL | 3 refills | Status: AC

## 2019-01-04 NOTE — Progress Notes (Signed)
Subjective:     Patient ID: Joseph Mcintyre, male   DOB: [birth year], 78 y.o.   MRN: [id number]  HPI Patient is seen for elevated blood pressure concerns.  He is followed by the [facility] and his current medication regimen includes captopril 50 mg twice daily, atenolol 50 mg daily, and HCTZ 25 mg daily.  He has recently had some low-grade diffuse headaches and feeling flushed.  No chest pains.  No dizziness.  No peripheral edema.  He has not taken any recent nonsteroidals.  No alcohol use.  Does consume some high sodium foods occasionally.  He has had several home blood pressures around 749 systolic and 44H diastolic.  He is compliant with his regular medications.  He states he had labs through the [place] back in November which were stable with exception of high triglycerides.  No recent focal neurologic concerns.  No regular exercise.  Past Medical History:  Diagnosis Date  . Anxiety    back pain and steriod injection  . Arthritis   . Cerebrovascular accident ([place])   . Colon polyps   . Hyperlipidemia   . Hypertension   . Prediabetes    Past Surgical History:  Procedure Laterality Date  . APPENDECTOMY    . Arm surgery tenodon Left 07/2016  . EYE SURGERY Bilateral    Cataract  . [place]    reports that he has never smoked. He has never used smokeless tobacco. He reports that he does not drink alcohol or use drugs. family history includes Diabetes in his mother; Heart disease in his father. No Known Allergies   Review of Systems  Constitutional: Negative for fatigue and unexpected weight change.  Eyes: Negative for visual disturbance.  Respiratory: Negative for cough, chest tightness and shortness of breath.   Cardiovascular: Negative for chest pain, palpitations and leg swelling.  Endocrine: Negative for polydipsia and polyuria.  Neurological: Positive for headaches. Negative for dizziness, syncope, weakness and light-headedness.       Objective:   Physical  Exam Constitutional:      Appearance: He is well-developed.  HENT:     Right Ear: External ear normal.     Left Ear: External ear normal.  Eyes:     Pupils: Pupils are equal, round, and reactive to light.  Neck:     Musculoskeletal: Neck supple.     Thyroid: No thyromegaly.  Cardiovascular:     Rate and Rhythm: Normal rate and regular rhythm.  Pulmonary:     Effort: Pulmonary effort is normal. No respiratory distress.     Breath sounds: Normal breath sounds. No wheezing or rales.  Musculoskeletal:     Right lower leg: No edema.     Left lower leg: No edema.  Neurological:     Mental Status: He is alert and oriented to person, place, and time.        Assessment:     #1 hypertension suboptimally controlled  #2 remote history of reported CVA  #3 recent mild daily headaches possibly related to #1    Plan:     -Discussed nonpharmacologic management with sodium reduction and avoidance of non-steroidals -Recommend add amlodipine 5 mg daily and continue his other medications. -We explained that his medications could be simplified by changing to another ACE inhibitor in combination with HCTZ but at this point he wishes to keep his other medicines the same -Try to keep sodium intake less than 2500 mg daily -Reassess in 1 month   W   MD [facility]

## 2019-01-04 NOTE — Patient Instructions (Signed)
Lets plan on one month follow up after starting the Amlodipine.

## 2019-01-08 ENCOUNTER — Ambulatory Visit: Payer: Self-pay | Admitting: *Deleted

## 2019-01-08 NOTE — Telephone Encounter (Signed)
Called patient and gave him the message from Dr. Elease Hashimoto and advised for him to stay well hydrated.

## 2019-01-08 NOTE — Telephone Encounter (Signed)
Stay the course.  Amlodipine can take a few weeks to see full effect. Make sure he has follow up with me scheduled.

## 2019-01-08 NOTE — Telephone Encounter (Signed)
Pt was seen for an OV on Monday with Dr. Elease Hashimoto and was started on Amlodipine 5 mg.Pt calling today to report reading and states he notes that he had fluctuations in BP readings since starting on the medication. Pt reports that he was advised to take Amlodipine in the evening and he usually takes it around 7 pm. Pt states he also takes other BP medications and he usually takes them around 6:00-6:30 am. Pt reports that his following readings today: 124/85 then 136/91 at 10 am. Around  noon 155/103. Pt states his face felt a little flushed, like his cheeks got red with the higher reading but denies any chest pain, headache dizziness or other symptoms at this time.  On yesterday, in the morning BP was 141/87, and then 135/72 about 2 pm and then 153/91 and 142/84 later on during the day.  On another day(pt unable to verify specific day) BP was noted to be 153/93.   Reason for Disposition . [9] Systolic BP  >= 323 OR Diastolic >= 80 AND [5] taking BP medications  Answer Assessment - Initial Assessment Questions 1. BLOOD PRESSURE: "What is the blood pressure?" "Did you take at least two measurements 5 minutes apart?"     124/85, 136/91 and then 155/103 2. ONSET: "When did you take your blood pressure?"     Prior to calling doctor's office 3. HOW: "How did you obtain the blood pressure?" (e.g., visiting nurse, automatic home BP monitor)     Automatic wrist monitor 4. HISTORY: "Do you have a history of high blood pressure?"     yes 5. MEDICATIONS: "Are you taking any medications for blood pressure?" "Have you missed any doses recently?"     Yes taking BP medications and on Monday, Amlodipine 5 mg was added 6. OTHER SYMPTOMS: "Do you have any symptoms?" (e.g., headache, chest pain, blurred vision, difficulty breathing, weakness)     No  Protocols used: HIGH BLOOD PRESSURE-A-AH

## 2019-01-08 NOTE — Telephone Encounter (Signed)
Please advise 

## 2019-02-05 ENCOUNTER — Ambulatory Visit (INDEPENDENT_AMBULATORY_CARE_PROVIDER_SITE_OTHER): Payer: Medicare Other | Admitting: Family Medicine

## 2019-02-05 ENCOUNTER — Encounter: Payer: Self-pay | Admitting: Family Medicine

## 2019-02-05 ENCOUNTER — Other Ambulatory Visit: Payer: Self-pay

## 2019-02-05 VITALS — BP 138/70 | HR 69 | Temp 98.0°F | Ht 69.0 in | Wt 175.2 lb

## 2019-02-05 DIAGNOSIS — I10 Essential (primary) hypertension: Secondary | ICD-10-CM | POA: Diagnosis not present

## 2019-02-05 NOTE — Patient Instructions (Signed)
Monitor blood pressure and be in touch if consistently > 140/90.   

## 2019-02-05 NOTE — Progress Notes (Signed)
  Subjective:     Patient ID: CID AGENA, male   DOB: 31-Oct-1941, 78 y.o.   MRN: [id number]  HPI Here for follow-up hypertension.  He is seen with the [place] and was already on regimen of HCTZ, captopril, and atenolol.  Last visit he came in with elevated reading here of almost 163 systolic and also had several home readings very similar.  We added amlodipine 5 mg daily.  He has had no side effects.  No edema.  No headaches.  Feels better overall.  Less flushing.  Home blood pressures reviewed and these are fairly consistently below 846 systolic and below 90 diastolic.  He feels better overall.  Past Medical History:  Diagnosis Date  . Anxiety    back pain and steriod injection  . Arthritis   . Cerebrovascular accident ([place])   . Colon polyps   . Hyperlipidemia   . Hypertension   . Prediabetes    Past Surgical History:  Procedure Laterality Date  . APPENDECTOMY    . Arm surgery tenodon Left 07/2016  . EYE SURGERY Bilateral    Cataract  . [place]    reports that he has never smoked. He has never used smokeless tobacco. He reports that he does not drink alcohol or use drugs. family history includes Diabetes in his mother; Heart disease in his father. No Known Allergies   Review of Systems  Constitutional: Negative for fatigue and unexpected weight change.  Eyes: Negative for visual disturbance.  Respiratory: Negative for cough, chest tightness and shortness of breath.   Cardiovascular: Negative for chest pain, palpitations and leg swelling.  Endocrine: Negative for polydipsia and polyuria.  Neurological: Negative for dizziness, syncope, weakness, light-headedness and headaches.       Objective:   Physical Exam Constitutional:      Appearance: He is well-developed.  Eyes:     Pupils: Pupils are equal, round, and reactive to light.  Neck:     Thyroid: No thyromegaly.  Cardiovascular:     Rate and Rhythm: Normal rate and regular rhythm.  Pulmonary:   Effort: Pulmonary effort is normal. No respiratory distress.     Breath sounds: Normal breath sounds. No wheezing or rales.  Neurological:     Mental Status: He is alert.        Assessment:     Hypertension.  Improved with recent addition of amlodipine    Plan:     -Continue current blood pressure medication regimen. -We reiterated nonpharmacologic factors that can affect blood pressure -We will plan yearly follow-up unless he has concerns for changing blood pressure and will continue to monitor at least a couple times weekly  Eulas Post MD [facility]

## 2019-02-10 ENCOUNTER — Ambulatory Visit (INDEPENDENT_AMBULATORY_CARE_PROVIDER_SITE_OTHER): Payer: Medicare Other

## 2019-02-10 ENCOUNTER — Encounter: Payer: Self-pay | Admitting: Podiatry

## 2019-02-10 ENCOUNTER — Ambulatory Visit (INDEPENDENT_AMBULATORY_CARE_PROVIDER_SITE_OTHER): Payer: Medicare Other | Admitting: Podiatry

## 2019-02-10 DIAGNOSIS — M21619 Bunion of unspecified foot: Secondary | ICD-10-CM | POA: Diagnosis not present

## 2019-02-10 DIAGNOSIS — M779 Enthesopathy, unspecified: Principal | ICD-10-CM

## 2019-02-10 DIAGNOSIS — M7751 Other enthesopathy of right foot: Secondary | ICD-10-CM

## 2019-02-10 DIAGNOSIS — M778 Other enthesopathies, not elsewhere classified: Secondary | ICD-10-CM

## 2019-02-10 DIAGNOSIS — M7752 Other enthesopathy of left foot: Secondary | ICD-10-CM | POA: Diagnosis not present

## 2019-02-10 MED ORDER — TRIAMCINOLONE ACETONIDE 10 MG/ML IJ SUSP
10.0000 mg | Freq: Once | INTRAMUSCULAR | Status: AC
  Administered 2019-02-10: 10 mg

## 2019-02-11 NOTE — Progress Notes (Signed)
Subjective:   Patient ID: Joseph Mcintyre, male   DOB: 78 y.o.   MRN: [id number]   HPI Patient presents with quite a bit of discomfort left foot over right foot in the MPJ and states does not remember specific injury and also does have bunion deformity which is gradually more bothersome   ROS      Objective:  Physical Exam  Neurovascular status intact with inflammation pain of the second MPJ left with moderate structural bunion deformity bilateral     Assessment:  Bunion deformity along with inflammatory capsulitis left over right     Plan:  H&P condition reviewed and recommended trying to work on the joint itself and discussed bunion and possible correction in future.  Today I did a sterile prep of the left I injected the second MPJ after aspiration with 3 mg Dexasone Kenalog applied padding to reduce stress on the joint and reappoint to recheck again as symptoms indicate  X-rays indicate that there is moderate structural deformity bilateral with no indications of advanced pathology and moderate structural bunions

## 2019-05-18 ENCOUNTER — Encounter: Payer: Self-pay | Admitting: Family Medicine

## 2019-05-18 DIAGNOSIS — L218 Other seborrheic dermatitis: Secondary | ICD-10-CM | POA: Diagnosis not present

## 2019-05-18 DIAGNOSIS — C4442 Squamous cell carcinoma of skin of scalp and neck: Secondary | ICD-10-CM | POA: Diagnosis not present

## 2019-05-18 DIAGNOSIS — L814 Other melanin hyperpigmentation: Secondary | ICD-10-CM | POA: Diagnosis not present

## 2019-05-18 DIAGNOSIS — D1801 Hemangioma of skin and subcutaneous tissue: Secondary | ICD-10-CM | POA: Diagnosis not present

## 2019-05-18 DIAGNOSIS — C44229 Squamous cell carcinoma of skin of left ear and external auricular canal: Secondary | ICD-10-CM | POA: Diagnosis not present

## 2019-05-18 DIAGNOSIS — L570 Actinic keratosis: Secondary | ICD-10-CM | POA: Diagnosis not present

## 2019-05-18 DIAGNOSIS — L821 Other seborrheic keratosis: Secondary | ICD-10-CM | POA: Diagnosis not present

## 2019-05-18 DIAGNOSIS — D485 Neoplasm of uncertain behavior of skin: Secondary | ICD-10-CM | POA: Diagnosis not present

## 2019-05-18 DIAGNOSIS — D225 Melanocytic nevi of trunk: Secondary | ICD-10-CM | POA: Diagnosis not present

## 2019-06-09 DIAGNOSIS — C44229 Squamous cell carcinoma of skin of left ear and external auricular canal: Secondary | ICD-10-CM | POA: Diagnosis not present

## 2019-06-23 DIAGNOSIS — H01021 Squamous blepharitis right upper eyelid: Secondary | ICD-10-CM | POA: Diagnosis not present

## 2019-06-23 DIAGNOSIS — H26492 Other secondary cataract, left eye: Secondary | ICD-10-CM | POA: Diagnosis not present

## 2019-06-23 DIAGNOSIS — H01024 Squamous blepharitis left upper eyelid: Secondary | ICD-10-CM | POA: Diagnosis not present

## 2019-06-23 DIAGNOSIS — H35371 Puckering of macula, right eye: Secondary | ICD-10-CM | POA: Diagnosis not present

## 2019-06-24 DIAGNOSIS — C4442 Squamous cell carcinoma of skin of scalp and neck: Secondary | ICD-10-CM | POA: Diagnosis not present

## 2019-07-19 ENCOUNTER — Other Ambulatory Visit: Payer: Self-pay

## 2019-08-19 DIAGNOSIS — L570 Actinic keratosis: Secondary | ICD-10-CM | POA: Diagnosis not present

## 2019-08-19 DIAGNOSIS — L821 Other seborrheic keratosis: Secondary | ICD-10-CM | POA: Diagnosis not present

## 2019-08-19 DIAGNOSIS — L308 Other specified dermatitis: Secondary | ICD-10-CM | POA: Diagnosis not present

## 2019-08-19 DIAGNOSIS — Z85828 Personal history of other malignant neoplasm of skin: Secondary | ICD-10-CM | POA: Diagnosis not present

## 2019-08-19 DIAGNOSIS — L814 Other melanin hyperpigmentation: Secondary | ICD-10-CM | POA: Diagnosis not present

## 2019-11-18 DIAGNOSIS — L570 Actinic keratosis: Secondary | ICD-10-CM | POA: Diagnosis not present

## 2019-11-18 DIAGNOSIS — L218 Other seborrheic dermatitis: Secondary | ICD-10-CM | POA: Diagnosis not present

## 2019-11-18 DIAGNOSIS — L821 Other seborrheic keratosis: Secondary | ICD-10-CM | POA: Diagnosis not present

## 2020-02-10 ENCOUNTER — Ambulatory Visit: Payer: Medicare Other | Attending: Internal Medicine

## 2020-02-10 DIAGNOSIS — Z23 Encounter for immunization: Secondary | ICD-10-CM | POA: Insufficient documentation

## 2020-02-10 NOTE — Progress Notes (Signed)
   Covid-19 Vaccination Clinic  Name:  Joseph Mcintyre    MRN: [id number] DOB: 10-01-1941  02/10/2020  [name] was observed post Covid-19 immunization for 15 minutes without incidence. He was provided with Vaccine Information Sheet and instruction to access the V-Safe system.   [name] was instructed to call 911 with any severe reactions post vaccine: Marland Kitchen Difficulty breathing  . Swelling of your face and throat  . A fast heartbeat  . A bad rash all over your body  . Dizziness and weakness    Immunizations Administered    Name Date Dose VIS Date Route   Pfizer COVID-19 Vaccine 02/10/2020  9:26 AM 0.3 mL 11/26/2019 Intramuscular   Manufacturer: [place]   Lot: [id number]   [place]: [id number]

## 2020-03-07 ENCOUNTER — Ambulatory Visit: Payer: Medicare Other | Attending: Internal Medicine

## 2020-03-07 DIAGNOSIS — Z23 Encounter for immunization: Secondary | ICD-10-CM

## 2020-03-07 NOTE — Progress Notes (Signed)
   Covid-19 Vaccination Clinic  Name:  Joseph Mcintyre    MRN: [id number] DOB: 1941/03/23  03/07/2020  Mr. Joseph Mcintyre was observed post Covid-19 immunization for 15 minutes without incident. He was provided with Vaccine Information Sheet and instruction to access the V-Safe system.   Mr. Joseph Mcintyre was instructed to call 911 with any severe reactions post vaccine: Marland Kitchen Difficulty breathing  . Swelling of face and throat  . A fast heartbeat  . A bad rash all over body  . Dizziness and weakness   Immunizations Administered    Name Date Dose VIS Date Route   Pfizer COVID-19 Vaccine 03/07/2020  8:38 AM 0.3 mL 11/26/2019 Intramuscular   Manufacturer: [place]   Lot: [id number]   [place]: [id number]

## 2020-05-18 DIAGNOSIS — L820 Inflamed seborrheic keratosis: Secondary | ICD-10-CM | POA: Diagnosis not present

## 2020-05-18 DIAGNOSIS — Z85828 Personal history of other malignant neoplasm of skin: Secondary | ICD-10-CM | POA: Diagnosis not present

## 2020-05-18 DIAGNOSIS — L905 Scar conditions and fibrosis of skin: Secondary | ICD-10-CM | POA: Diagnosis not present

## 2020-05-18 DIAGNOSIS — B353 Tinea pedis: Secondary | ICD-10-CM | POA: Diagnosis not present

## 2020-05-18 DIAGNOSIS — D225 Melanocytic nevi of trunk: Secondary | ICD-10-CM | POA: Diagnosis not present

## 2020-05-18 DIAGNOSIS — L570 Actinic keratosis: Secondary | ICD-10-CM | POA: Diagnosis not present

## 2020-07-27 DIAGNOSIS — H01021 Squamous blepharitis right upper eyelid: Secondary | ICD-10-CM | POA: Diagnosis not present

## 2020-07-27 DIAGNOSIS — H35371 Puckering of macula, right eye: Secondary | ICD-10-CM | POA: Diagnosis not present

## 2020-07-27 DIAGNOSIS — H01024 Squamous blepharitis left upper eyelid: Secondary | ICD-10-CM | POA: Diagnosis not present

## 2020-07-27 DIAGNOSIS — H26492 Other secondary cataract, left eye: Secondary | ICD-10-CM | POA: Diagnosis not present

## 2020-11-17 DIAGNOSIS — L821 Other seborrheic keratosis: Secondary | ICD-10-CM | POA: Diagnosis not present

## 2020-11-17 DIAGNOSIS — B351 Tinea unguium: Secondary | ICD-10-CM | POA: Diagnosis not present

## 2020-11-17 DIAGNOSIS — B353 Tinea pedis: Secondary | ICD-10-CM | POA: Diagnosis not present

## 2020-11-17 DIAGNOSIS — Z85828 Personal history of other malignant neoplasm of skin: Secondary | ICD-10-CM | POA: Diagnosis not present

## 2020-11-17 DIAGNOSIS — L814 Other melanin hyperpigmentation: Secondary | ICD-10-CM | POA: Diagnosis not present

## 2020-11-17 DIAGNOSIS — L905 Scar conditions and fibrosis of skin: Secondary | ICD-10-CM | POA: Diagnosis not present

## 2020-11-17 DIAGNOSIS — L570 Actinic keratosis: Secondary | ICD-10-CM | POA: Diagnosis not present

## 2021-02-15 DIAGNOSIS — L570 Actinic keratosis: Secondary | ICD-10-CM | POA: Diagnosis not present

## 2021-06-05 DIAGNOSIS — C44329 Squamous cell carcinoma of skin of other parts of face: Secondary | ICD-10-CM | POA: Diagnosis not present

## 2021-06-05 DIAGNOSIS — D485 Neoplasm of uncertain behavior of skin: Secondary | ICD-10-CM | POA: Diagnosis not present

## 2021-06-05 DIAGNOSIS — B079 Viral wart, unspecified: Secondary | ICD-10-CM | POA: Diagnosis not present

## 2021-10-26 ENCOUNTER — Ambulatory Visit (INDEPENDENT_AMBULATORY_CARE_PROVIDER_SITE_OTHER): Payer: Medicare Other | Admitting: Family Medicine

## 2021-10-26 VITALS — BP 120/64 | HR 60 | Temp 97.7°F | Wt 174.4 lb

## 2021-10-26 DIAGNOSIS — R202 Paresthesia of skin: Secondary | ICD-10-CM

## 2021-10-26 DIAGNOSIS — G629 Polyneuropathy, unspecified: Secondary | ICD-10-CM

## 2021-10-26 DIAGNOSIS — R739 Hyperglycemia, unspecified: Secondary | ICD-10-CM

## 2021-10-26 LAB — HEMOGLOBIN A1C: Hgb A1c MFr Bld: 6.5 % (ref 4.6–6.5)

## 2021-10-26 LAB — VITAMIN B12: Vitamin B-12: 346 pg/mL (ref 211–911)

## 2021-10-26 MED ORDER — GABAPENTIN 100 MG PO CAPS: 100.0000 mg | ORAL_CAPSULE | Freq: Every day | ORAL | 3 refills | Status: DC

## 2021-10-26 NOTE — Addendum Note (Signed)
Addended by: Amanda Cockayne on: 10/26/2021 11:55 AM   Modules accepted: Orders

## 2021-10-26 NOTE — Patient Instructions (Signed)
Start the Gabapentin one at night.   If no improvement in 3-4 nights may increase to two at night.

## 2021-10-26 NOTE — Addendum Note (Signed)
Addended by: Rosalyn Gess D on: 10/26/2021 11:51 AM   Modules accepted: Orders

## 2021-10-26 NOTE — Progress Notes (Signed)
Established Patient Office Visit  Subjective:  Patient ID: Joseph Mcintyre, male    DOB: 1941-07-28  Age: 80 y.o. MRN: [id number]  CC:  Chief Complaint  Patient presents with   Foot Pain    Bilateral, x 6 months, aching, stiff, always hot or cold but never comfortable, painful to walk without shoes,     HPI Joseph Mcintyre presents with 30-month history of progressive bilateral foot symptoms.  He has especially pain at night at rest.  Ambulating without difficulty.  He frequently has a "burning "type pain especially on the bottoms of the feet.  This seems to extend to the lower leg region.  No upper extremity symptoms.  No past history of known neuropathy.  He has had some mild hyperglycemia by previous labs but no history of diabetes.  No alcohol use.  No history of B12 deficiency.  He is followed by the [facility] for his hypertension and states he had some recent labs but is not sure what was actually done there.  Appetite and weight are stable.  Denies any lower extremity weakness.  Pain is starting to become more disruptive with sleep at night.  He rates the pain 5 out of 10 in severity.  Past Medical History:  Diagnosis Date   Anxiety    back pain and steriod injection   Arthritis    Cerebrovascular accident [facility])    Colon polyps    Hyperlipidemia    Hypertension    Prediabetes     Past Surgical History:  Procedure Laterality Date   APPENDECTOMY     Arm surgery tenodon Left 07/2016   EYE SURGERY Bilateral    Cataract   HERNIA REPAIR  1983, 1966    Family History  Problem Relation Age of Onset   Diabetes Mother        type ll   Heart disease Father     Social History   Socioeconomic History   Marital status: Married    Spouse name: Not on file   Number of children: Not on file   Years of education: Not on file   Highest education level: Not on file  Occupational History   Not on file  Tobacco Use   Smoking status: Never   Smokeless tobacco: Never  Vaping Use    Vaping Use: Never used  Substance and Sexual Activity   Alcohol use: No    Alcohol/week: 0.0 standard drinks   Drug use: No   Sexual activity: Not on file  Other Topics Concern   Not on file  Social History Narrative   Not on file   Social Determinants of Health   Financial Resource Strain: Not on file  Food Insecurity: Not on file  Transportation Needs: Not on file  Physical Activity: Not on file  Stress: Not on file  Social Connections: Not on file  Intimate Partner Violence: Not on file    Outpatient Medications Prior to Visit  Medication Sig Dispense Refill   amLODipine (NORVASC) 5 MG tablet Take 1 tablet (5 mg total) by mouth daily. 90 tablet 3   atenolol (TENORMIN) 50 MG tablet Take 50 mg by mouth daily.     captopril (CAPOTEN) 50 MG tablet      hydrochlorothiazide (HYDRODIURIL) 25 MG tablet Take 25 mg by mouth daily.     No facility-administered medications prior to visit.    No Known Allergies  ROS Review of Systems  Constitutional:  Negative for appetite change, chills, fever and  unexpected weight change.  Respiratory:  Negative for cough and shortness of breath.   Cardiovascular:  Negative for chest pain.  Gastrointestinal:  Negative for abdominal pain.  Skin:  Negative for rash.  Neurological:  Positive for numbness. Negative for weakness.  Hematological:  Negative for adenopathy.     Objective:    Physical Exam Vitals reviewed.  Constitutional:      Appearance: Normal appearance.  Cardiovascular:     Rate and Rhythm: Normal rate.     Comments: Feet are both warm to touch with good capillary refill throughout and palpable dorsalis pedis pulses. Pulmonary:     Effort: Pulmonary effort is normal.     Breath sounds: Normal breath sounds.  Musculoskeletal:     Right lower leg: No edema.     Left lower leg: No edema.  Neurological:     Mental Status: He is alert.     Comments: Trace to 1+ reflexes knee and ankle bilaterally.  He has full strength  with plantarflexion and dorsiflexion bilaterally.  He does have impairment with monofilament testing both feet.  This involves the ankle region but not the lower leg region    BP 120/64 (BP Location: Left Arm, Patient Position: Sitting, Cuff Size: Normal)   Pulse 60   Temp 97.7 F (36.5 C) (Oral)   Wt 174 lb 6.4 oz (79.1 kg)   SpO2 99%   BMI 25.75 kg/m  Wt Readings from Last 3 Encounters:  10/26/21 174 lb 6.4 oz (79.1 kg)  02/05/19 175 lb 3.2 oz (79.5 kg)  01/04/19 169 lb 1.6 oz (76.7 kg)     Health Maintenance Due  Topic Date Due   Hepatitis C Screening  Never done   Zoster Vaccines- Shingrix (1 of 2) Never done   Pneumonia Vaccine 69+ Years old (2 - PCV) 12/16/2008   COVID-19 Vaccine (3 - Pfizer risk series) 04/04/2020    There are no preventive care reminders to display for this patient.  Lab Results  Component Value Date   TSH 1.13 06/17/2016   Lab Results  Component Value Date   WBC 10.0 06/14/2016   HGB 14.8 06/14/2016   HCT 43.4 06/14/2016   MCV 86.5 06/14/2016   PLT 201 06/14/2016   Lab Results  Component Value Date   NA 136 06/14/2016   K 3.9 06/14/2016   CO2 29 06/14/2016   GLUCOSE 103 (H) 06/14/2016   BUN 18 06/14/2016   CREATININE 0.85 06/14/2016   BILITOT 1.0 06/14/2016   ALKPHOS 54 06/14/2016   AST 20 06/14/2016   ALT 16 (L) 06/14/2016   PROT 6.8 06/14/2016   ALBUMIN 4.1 06/14/2016   CALCIUM 9.7 06/14/2016   ANIONGAP 9 06/14/2016   GFR 76.75 12/25/2012   Lab Results  Component Value Date   CHOL 184 12/28/2010   Lab Results  Component Value Date   HDL 28.40 (L) 12/28/2010   Lab Results  Component Value Date   LDLCALC 117 (H) 12/28/2010   Lab Results  Component Value Date   TRIG 195.0 (H) 12/28/2010   Lab Results  Component Value Date   CHOLHDL 6 12/28/2010   No results found for: HGBA1C    Assessment & Plan:   Problem List Items Addressed This Visit   None Visit Diagnoses     Neuropathy    -  Primary   Relevant  Orders   Hemoglobin A1c   Electrophoresis, Protein and Immunofixation   Vitamin B12   Paresthesia  Relevant Orders   Electrophoresis, Protein and Immunofixation   Vitamin B12   Hyperglycemia       Relevant Orders   Hemoglobin A1c     Patient presents with 40-month history of progressive bilateral foot symptoms with burning dysesthesias and paresthesias.  No known history of diabetes but has had mild hyperglycemia in the past.  -Recommend check A1c, B12, SPEP -Consider trial of low-dose gabapentin 100 mg nightly and if no improvement a few nights increase further to 200 mg. -Set up 1 month follow-up. -Consider neurology referral especially depending on response to medication and labs above  Meds ordered this encounter  Medications   gabapentin (NEURONTIN) 100 MG capsule    Sig: Take 1 capsule (100 mg total) by mouth at bedtime.    Dispense:  60 capsule    Refill:  3    Follow-up: Return in about 1 month (around 11/25/2021).    Carolann Littler, MD

## 2021-11-06 LAB — MULTIPLE MYELOMA PANEL, SERUM
IgA/Immunoglobulin A, Serum: 476 mg/dL — ABNORMAL HIGH (ref 61–437)
IgG (Immunoglobin G), Serum: 1179 mg/dL (ref 603–1613)
IgM (Immunoglobulin M), Srm: 27 mg/dL (ref 15–143)
Total Protein: 7.4 g/dL (ref 6.0–8.5)

## 2021-11-07 ENCOUNTER — Other Ambulatory Visit: Payer: Self-pay

## 2021-11-07 DIAGNOSIS — R202 Paresthesia of skin: Secondary | ICD-10-CM

## 2021-11-07 DIAGNOSIS — G629 Polyneuropathy, unspecified: Secondary | ICD-10-CM

## 2021-11-12 ENCOUNTER — Other Ambulatory Visit (INDEPENDENT_AMBULATORY_CARE_PROVIDER_SITE_OTHER): Payer: Medicare Other

## 2021-11-12 DIAGNOSIS — G629 Polyneuropathy, unspecified: Secondary | ICD-10-CM

## 2021-11-12 DIAGNOSIS — R202 Paresthesia of skin: Secondary | ICD-10-CM | POA: Diagnosis not present

## 2021-11-14 LAB — MULTIPLE MYELOMA PANEL, SERUM
Albumin SerPl Elph-Mcnc: 3.8 g/dL (ref 2.9–4.4)
Albumin/Glob SerPl: 1.2 (ref 0.7–1.7)
Alpha 1: 0.2 g/dL (ref 0.0–0.4)
Alpha2 Glob SerPl Elph-Mcnc: 0.8 g/dL (ref 0.4–1.0)
B-Globulin SerPl Elph-Mcnc: 1.2 g/dL (ref 0.7–1.3)
Gamma Glob SerPl Elph-Mcnc: 1.1 g/dL (ref 0.4–1.8)
Globulin, Total: 3.3 g/dL (ref 2.2–3.9)
IgA/Immunoglobulin A, Serum: 490 mg/dL — ABNORMAL HIGH (ref 61–437)
IgG (Immunoglobin G), Serum: 1166 mg/dL (ref 603–1613)
IgM (Immunoglobulin M), Srm: 29 mg/dL (ref 15–143)
Total Protein: 7.1 g/dL (ref 6.0–8.5)

## 2021-11-15 DIAGNOSIS — H02135 Senile ectropion of left lower eyelid: Secondary | ICD-10-CM | POA: Diagnosis not present

## 2021-11-15 DIAGNOSIS — H02132 Senile ectropion of right lower eyelid: Secondary | ICD-10-CM | POA: Diagnosis not present

## 2021-11-15 DIAGNOSIS — H26492 Other secondary cataract, left eye: Secondary | ICD-10-CM | POA: Diagnosis not present

## 2021-11-15 DIAGNOSIS — H35371 Puckering of macula, right eye: Secondary | ICD-10-CM | POA: Diagnosis not present

## 2021-11-19 DIAGNOSIS — L820 Inflamed seborrheic keratosis: Secondary | ICD-10-CM | POA: Diagnosis not present

## 2021-11-19 DIAGNOSIS — C4442 Squamous cell carcinoma of skin of scalp and neck: Secondary | ICD-10-CM | POA: Diagnosis not present

## 2021-11-19 DIAGNOSIS — D225 Melanocytic nevi of trunk: Secondary | ICD-10-CM | POA: Diagnosis not present

## 2021-11-19 DIAGNOSIS — L821 Other seborrheic keratosis: Secondary | ICD-10-CM | POA: Diagnosis not present

## 2021-11-19 DIAGNOSIS — B353 Tinea pedis: Secondary | ICD-10-CM | POA: Diagnosis not present

## 2021-11-19 DIAGNOSIS — L538 Other specified erythematous conditions: Secondary | ICD-10-CM | POA: Diagnosis not present

## 2021-11-19 DIAGNOSIS — Z08 Encounter for follow-up examination after completed treatment for malignant neoplasm: Secondary | ICD-10-CM | POA: Diagnosis not present

## 2021-11-19 DIAGNOSIS — L814 Other melanin hyperpigmentation: Secondary | ICD-10-CM | POA: Diagnosis not present

## 2021-11-19 DIAGNOSIS — B351 Tinea unguium: Secondary | ICD-10-CM | POA: Diagnosis not present

## 2021-11-19 DIAGNOSIS — C44229 Squamous cell carcinoma of skin of left ear and external auricular canal: Secondary | ICD-10-CM | POA: Diagnosis not present

## 2021-11-19 DIAGNOSIS — L570 Actinic keratosis: Secondary | ICD-10-CM | POA: Diagnosis not present

## 2021-11-19 DIAGNOSIS — Z85828 Personal history of other malignant neoplasm of skin: Secondary | ICD-10-CM | POA: Diagnosis not present

## 2021-11-26 ENCOUNTER — Ambulatory Visit (INDEPENDENT_AMBULATORY_CARE_PROVIDER_SITE_OTHER): Payer: Medicare Other | Admitting: Family Medicine

## 2021-11-26 DIAGNOSIS — R739 Hyperglycemia, unspecified: Secondary | ICD-10-CM | POA: Diagnosis not present

## 2021-11-26 DIAGNOSIS — G549 Nerve root and plexus disorder, unspecified: Secondary | ICD-10-CM | POA: Insufficient documentation

## 2021-11-26 MED ORDER — GABAPENTIN 100 MG PO CAPS: ORAL_CAPSULE | ORAL | 3 refills | Status: DC

## 2021-11-26 NOTE — Progress Notes (Signed)
Established Patient Office Visit  Subjective:  Patient ID: Joseph Mcintyre, male    DOB: 05-17-1941  Age: 80 y.o. MRN: [id number]  CC:  Chief Complaint  Patient presents with   Follow-up    HPI Joseph Mcintyre presents for follow-up regarding recent neuropathy symptoms.  Refer to previous note for details.  He was having some burning dysesthesias and paresthesias involving most of the feet.  He rated it as 6 out of 10 in severity.  A1c was 6.5%.  B12 normal.  Serum protein electrophoresis showed only nonspecific mild IgA elevation.  We started gabapentin 100 mg and with taking 2 of those at night he has had good improvement in his pain.  Recent pain about 2 out of 6.  He feels his pain is currently adequately controlled and denies any major side effects from gabapentin.  Denies any lower extremity weakness.  Patient was in the service for 26 years.  He is not aware of any major exposure to agent orange.  He has scaled back his sugar and starch intake somewhat since last visit.  No polyuria or polydipsia.  Past Medical History:  Diagnosis Date   Anxiety    back pain and steriod injection   Arthritis    Cerebrovascular accident [facility])    Colon polyps    Hyperlipidemia    Hypertension    Prediabetes     Past Surgical History:  Procedure Laterality Date   APPENDECTOMY     Arm surgery tenodon Left 07/2016   EYE SURGERY Bilateral    Cataract   HERNIA REPAIR  1983, 1966    Family History  Problem Relation Age of Onset   Diabetes Mother        type ll   Heart disease Father     Social History   Socioeconomic History   Marital status: Married    Spouse name: Not on file   Number of children: Not on file   Years of education: Not on file   Highest education level: Not on file  Occupational History   Not on file  Tobacco Use   Smoking status: Never   Smokeless tobacco: Never  Vaping Use   Vaping Use: Never used  Substance and Sexual Activity   Alcohol use: No     Alcohol/week: 0.0 standard drinks   Drug use: No   Sexual activity: Not on file  Other Topics Concern   Not on file  Social History Narrative   Not on file   Social Determinants of Health   Financial Resource Strain: Not on file  Food Insecurity: Not on file  Transportation Needs: Not on file  Physical Activity: Not on file  Stress: Not on file  Social Connections: Not on file  Intimate Partner Violence: Not on file    Outpatient Medications Prior to Visit  Medication Sig Dispense Refill   amLODipine (NORVASC) 5 MG tablet Take 1 tablet (5 mg total) by mouth daily. 90 tablet 3   atenolol (TENORMIN) 50 MG tablet Take 50 mg by mouth daily.     captopril (CAPOTEN) 50 MG tablet      hydrochlorothiazide (HYDRODIURIL) 25 MG tablet Take 25 mg by mouth daily.     gabapentin (NEURONTIN) 100 MG capsule Take 1 capsule (100 mg total) by mouth at bedtime. 60 capsule 3   No facility-administered medications prior to visit.    No Known Allergies  ROS Review of Systems  Constitutional:  Negative for fatigue and unexpected weight  change.  Eyes:  Negative for visual disturbance.  Respiratory:  Negative for cough, chest tightness and shortness of breath.   Cardiovascular:  Negative for chest pain, palpitations and leg swelling.  Endocrine: Negative for polydipsia and polyuria.  Neurological:  Negative for dizziness, syncope, weakness, light-headedness and headaches.     Objective:    Physical Exam Vitals reviewed.  Cardiovascular:     Rate and Rhythm: Normal rate and regular rhythm.  Pulmonary:     Effort: Pulmonary effort is normal.     Breath sounds: Normal breath sounds.  Musculoskeletal:     Right lower leg: No edema.     Left lower leg: No edema.  Neurological:     Mental Status: He is alert.    BP 122/64 (BP Location: Left Arm, Patient Position: Sitting, Cuff Size: Normal)   Pulse (!) 59   Temp 97.6 F (36.4 C) (Oral)   Wt 167 lb 8 oz (76 kg)   SpO2 100%   BMI 24.74  kg/m  Wt Readings from Last 3 Encounters:  11/26/21 167 lb 8 oz (76 kg)  10/26/21 174 lb 6.4 oz (79.1 kg)  02/05/19 175 lb 3.2 oz (79.5 kg)     Health Maintenance Due  Topic Date Due   Hepatitis C Screening  Never done   Zoster Vaccines- Shingrix (1 of 2) Never done   Pneumonia Vaccine 11+ Years old (2 - PCV) 12/16/2008   COVID-19 Vaccine (3 - Pfizer risk series) 04/04/2020    There are no preventive care reminders to display for this patient.  Lab Results  Component Value Date   TSH 1.13 06/17/2016   Lab Results  Component Value Date   WBC 10.0 06/14/2016   HGB 14.8 06/14/2016   HCT 43.4 06/14/2016   MCV 86.5 06/14/2016   PLT 201 06/14/2016   Lab Results  Component Value Date   NA 136 06/14/2016   K 3.9 06/14/2016   CO2 29 06/14/2016   GLUCOSE 103 (H) 06/14/2016   BUN 18 06/14/2016   CREATININE 0.85 06/14/2016   BILITOT 1.0 06/14/2016   ALKPHOS 54 06/14/2016   AST 20 06/14/2016   ALT 16 (L) 06/14/2016   PROT 7.1 11/12/2021   ALBUMIN 4.1 06/14/2016   CALCIUM 9.7 06/14/2016   ANIONGAP 9 06/14/2016   GFR 76.75 12/25/2012   Lab Results  Component Value Date   CHOL 184 12/28/2010   Lab Results  Component Value Date   HDL 28.40 (L) 12/28/2010   Lab Results  Component Value Date   LDLCALC 117 (H) 12/28/2010   Lab Results  Component Value Date   TRIG 195.0 (H) 12/28/2010   Lab Results  Component Value Date   CHOLHDL 6 12/28/2010   Lab Results  Component Value Date   HGBA1C 6.5 10/26/2021      Assessment & Plan:   #1 sensory neuropathy involving both feet.  A1c 6.5%.  B12 and SPEP relatively normal.  Suspect this is more idiopathic.  He has had some improvement in pain level with gabapentin and feels that with dosage of 200 mg nightly pain is adequately improved.  Refill this dose for 1 year  #2 hyperglycemia with recent A1c 6.5%.  Patient has made some major lifestyle changes and dietary changes since then and already has lost some weight. -We  discussed diet for type 2 diabetes in some detail.  Recommend 2-month follow-up and recheck A1c then. -We have elected not to start any medications but to give the 77-month  trial of diet first  Meds ordered this encounter  Medications   DISCONTD: gabapentin (NEURONTIN) 100 MG capsule    Sig: Take two capsules by mouth at night    Dispense:  180 capsule    Refill:  3   gabapentin (NEURONTIN) 100 MG capsule    Sig: Take two capsules by mouth at night    Dispense:  180 capsule    Refill:  3    Follow-up: Return in about 6 months (around 05/27/2022).    Carolann Littler, MD

## 2021-11-29 DIAGNOSIS — Z961 Presence of intraocular lens: Secondary | ICD-10-CM | POA: Diagnosis not present

## 2021-11-29 DIAGNOSIS — H26492 Other secondary cataract, left eye: Secondary | ICD-10-CM | POA: Diagnosis not present

## 2021-11-29 DIAGNOSIS — H35371 Puckering of macula, right eye: Secondary | ICD-10-CM | POA: Diagnosis not present

## 2021-11-29 DIAGNOSIS — H18413 Arcus senilis, bilateral: Secondary | ICD-10-CM | POA: Diagnosis not present

## 2022-01-15 DIAGNOSIS — L814 Other melanin hyperpigmentation: Secondary | ICD-10-CM | POA: Diagnosis not present

## 2022-01-15 DIAGNOSIS — L820 Inflamed seborrheic keratosis: Secondary | ICD-10-CM | POA: Diagnosis not present

## 2022-01-15 DIAGNOSIS — D485 Neoplasm of uncertain behavior of skin: Secondary | ICD-10-CM | POA: Diagnosis not present

## 2022-01-15 DIAGNOSIS — L538 Other specified erythematous conditions: Secondary | ICD-10-CM | POA: Diagnosis not present

## 2022-01-15 DIAGNOSIS — L810 Postinflammatory hyperpigmentation: Secondary | ICD-10-CM | POA: Diagnosis not present

## 2022-01-15 DIAGNOSIS — L821 Other seborrheic keratosis: Secondary | ICD-10-CM | POA: Diagnosis not present

## 2022-01-15 DIAGNOSIS — L570 Actinic keratosis: Secondary | ICD-10-CM | POA: Diagnosis not present

## 2022-03-14 DIAGNOSIS — L570 Actinic keratosis: Secondary | ICD-10-CM | POA: Diagnosis not present

## 2022-03-14 DIAGNOSIS — L814 Other melanin hyperpigmentation: Secondary | ICD-10-CM | POA: Diagnosis not present

## 2022-03-14 DIAGNOSIS — L821 Other seborrheic keratosis: Secondary | ICD-10-CM | POA: Diagnosis not present

## 2022-05-27 ENCOUNTER — Encounter: Payer: Self-pay | Admitting: Family Medicine

## 2022-05-27 ENCOUNTER — Ambulatory Visit (INDEPENDENT_AMBULATORY_CARE_PROVIDER_SITE_OTHER): Payer: Medicare Other | Admitting: Family Medicine

## 2022-05-27 VITALS — BP 130/60 | HR 59 | Temp 97.8°F | Ht 69.0 in | Wt 161.0 lb

## 2022-05-27 DIAGNOSIS — G549 Nerve root and plexus disorder, unspecified: Secondary | ICD-10-CM | POA: Diagnosis not present

## 2022-05-27 DIAGNOSIS — R7309 Other abnormal glucose: Secondary | ICD-10-CM

## 2022-05-27 DIAGNOSIS — I10 Essential (primary) hypertension: Secondary | ICD-10-CM

## 2022-05-27 LAB — POCT GLYCOSYLATED HEMOGLOBIN (HGB A1C): Hemoglobin A1C: 5.9 % — AB (ref 4.0–5.6)

## 2022-05-27 NOTE — Patient Instructions (Signed)
A1C has improved from 6.5 to 5.9%.    Keep up the good work.

## 2022-05-27 NOTE — Progress Notes (Signed)
Established Patient Office Visit  Subjective   Patient ID: Joseph Mcintyre, male    DOB: Jan 01, 1941  Age: 81 y.o. MRN: [id number]  Chief Complaint  Patient presents with   Follow-up    HPI   History of hypertension, sensory neuropathy, prediabetes.  A1c was 6.5% 6 months ago.  He has made some dietary changes.  Eliminated sodas.  Scaled back sugar intake.  Weight down 6 pounds.  Feels good overall.  No polyuria or polydipsia.  He has hypertension treated with multidrug regimen including HCTZ, captopril, atenolol, and amlodipine.  Also followed through the [place].  He gets yearly labs including chemistries and lipids through the [place].  He has history of neuropathy involving predominantly feet.  He relates pain is 2 out of 10 currently on gabapentin 200 mg nightly.  He does feel the gabapentin has helped his pain.  No progressive symptoms.  B12 last year was normal and multiple myeloma panel unremarkable.  Past Medical History:  Diagnosis Date   Anxiety    back pain and steriod injection   Arthritis    Cerebrovascular accident [facility])    Colon polyps    Hyperlipidemia    Hypertension    Prediabetes    Past Surgical History:  Procedure Laterality Date   APPENDECTOMY     Arm surgery tenodon Left 07/2016   EYE SURGERY Bilateral    Cataract   HERNIA REPAIR  1983, 1966    reports that he has never smoked. He has never used smokeless tobacco. He reports that he does not drink alcohol and does not use drugs. family history includes Diabetes in his mother; Heart disease in his father. No Known Allergies  Review of Systems  Constitutional:  Negative for malaise/fatigue.  Eyes:  Negative for blurred vision.  Respiratory:  Negative for shortness of breath.   Cardiovascular:  Negative for chest pain.  Genitourinary:  Negative for dysuria.  Neurological:  Negative for dizziness, weakness and headaches.      Objective:     BP 130/60 (BP Location: Left Arm, Patient Position: Sitting,  Cuff Size: Normal)   Pulse (!) 59   Temp 97.8 F (36.6 C) (Oral)   Ht 5' 9" (1.753 m)   Wt 161 lb (73 kg)   SpO2 99%   BMI 23.78 kg/m    Physical Exam Constitutional:      Appearance: He is well-developed.  HENT:     Right Ear: External ear normal.     Left Ear: External ear normal.  Eyes:     Pupils: Pupils are equal, round, and reactive to light.  Neck:     Thyroid: No thyromegaly.  Cardiovascular:     Rate and Rhythm: Normal rate and regular rhythm.  Pulmonary:     Effort: Pulmonary effort is normal. No respiratory distress.     Breath sounds: Normal breath sounds. No wheezing or rales.  Musculoskeletal:     Cervical back: Neck supple.     Right lower leg: No edema.     Left lower leg: No edema.  Neurological:     Mental Status: He is alert and oriented to person, place, and time.      Results for orders placed or performed in visit on 05/27/22  POCT glycosylated hemoglobin (Hb A1C)  Result Value Ref Range   Hemoglobin A1C 5.9 (A) 4.0 - 5.6 %   HbA1c POC (<> result, manual entry)     HbA1c, POC (prediabetic range)     HbA1c,  POC (controlled diabetic range)        The 10-year ASCVD risk score (Arnett DK, et al., 2019) is: 39.7%* (Cholesterol units were assumed)    Assessment & Plan:   #1 prediabetes range blood sugars.  A1c is improved from 6.5-5.9 with some dietary modification.  Continue current lifestyle modification and check A1c at least yearly  #2 hypertension stable and at goal.  Continue current medication regimen.  [facility]  We have asked that he get a copy of his next labs and bring with him next visit  #3 sensory neuropathy involving mostly feet.  Stable on low-dose gabapentin.  Continue current dose of gabapentin 200 mg nightly   No follow-ups on file.    Bruce Burchette, MD  

## 2022-09-19 DIAGNOSIS — H02135 Senile ectropion of left lower eyelid: Secondary | ICD-10-CM | POA: Diagnosis not present

## 2022-09-19 DIAGNOSIS — H532 Diplopia: Secondary | ICD-10-CM | POA: Diagnosis not present

## 2022-09-19 DIAGNOSIS — H43812 Vitreous degeneration, left eye: Secondary | ICD-10-CM | POA: Diagnosis not present

## 2022-11-19 DIAGNOSIS — L814 Other melanin hyperpigmentation: Secondary | ICD-10-CM | POA: Diagnosis not present

## 2022-11-19 DIAGNOSIS — L570 Actinic keratosis: Secondary | ICD-10-CM | POA: Diagnosis not present

## 2022-11-19 DIAGNOSIS — C4442 Squamous cell carcinoma of skin of scalp and neck: Secondary | ICD-10-CM | POA: Diagnosis not present

## 2022-11-19 DIAGNOSIS — Z08 Encounter for follow-up examination after completed treatment for malignant neoplasm: Secondary | ICD-10-CM | POA: Diagnosis not present

## 2022-11-19 DIAGNOSIS — D225 Melanocytic nevi of trunk: Secondary | ICD-10-CM | POA: Diagnosis not present

## 2022-11-19 DIAGNOSIS — L218 Other seborrheic dermatitis: Secondary | ICD-10-CM | POA: Diagnosis not present

## 2022-11-19 DIAGNOSIS — Z85828 Personal history of other malignant neoplasm of skin: Secondary | ICD-10-CM | POA: Diagnosis not present

## 2022-11-19 DIAGNOSIS — C44229 Squamous cell carcinoma of skin of left ear and external auricular canal: Secondary | ICD-10-CM | POA: Diagnosis not present

## 2022-11-19 DIAGNOSIS — L821 Other seborrheic keratosis: Secondary | ICD-10-CM | POA: Diagnosis not present

## 2022-11-19 DIAGNOSIS — C679 Malignant neoplasm of bladder, unspecified: Secondary | ICD-10-CM | POA: Diagnosis not present

## 2022-12-19 DIAGNOSIS — H04123 Dry eye syndrome of bilateral lacrimal glands: Secondary | ICD-10-CM | POA: Diagnosis not present

## 2022-12-19 DIAGNOSIS — H02135 Senile ectropion of left lower eyelid: Secondary | ICD-10-CM | POA: Diagnosis not present

## 2022-12-19 DIAGNOSIS — H02132 Senile ectropion of right lower eyelid: Secondary | ICD-10-CM | POA: Diagnosis not present

## 2022-12-19 DIAGNOSIS — H35371 Puckering of macula, right eye: Secondary | ICD-10-CM | POA: Diagnosis not present

## 2022-12-23 ENCOUNTER — Other Ambulatory Visit: Payer: Self-pay | Admitting: Family Medicine

## 2023-05-13 ENCOUNTER — Telehealth: Payer: Self-pay | Admitting: Family Medicine

## 2023-05-13 MED ORDER — GABAPENTIN 100 MG PO CAPS: ORAL_CAPSULE | ORAL | 0 refills | Status: DC

## 2023-05-13 NOTE — Telephone Encounter (Signed)
Prescription Request  05/13/2023  LOV: 05/27/2022  What is the name of the medication or equipment?  gabapentin (NEURONTIN) 100 MG capsule  Have you contacted your pharmacy to request a refill? No   Which pharmacy would you like this sent to?  [id number] [place], [place] - [address] AT [facility] [address] [facility] [address] & [facility] [address] [place] [address] Phone: [phone] Fax: [phone]    Patient notified that their request is being sent to the clinical staff for review and that they should receive a response within 2 business days.   Please advise at Mobile [phone] (mobile)

## 2023-05-13 NOTE — Telephone Encounter (Signed)
Rx sent 

## 2023-05-13 NOTE — Addendum Note (Signed)
Addended by: Christy Sartorius on: 05/13/2023 09:35 AM   Modules accepted: Orders

## 2023-05-14 ENCOUNTER — Telehealth: Payer: Self-pay | Admitting: Family Medicine

## 2023-05-14 MED ORDER — GABAPENTIN 300 MG PO CAPS: 300.0000 mg | ORAL_CAPSULE | Freq: Every day | ORAL | 3 refills | Status: DC

## 2023-05-14 NOTE — Telephone Encounter (Signed)
I spoke with the patient and he reported he is currently taking Gabapentin 300 mg as opposed to taking Gabapentin 100 mg BID as prescribed. Patient requested updated prescription to reflect this change. Ok to send in Gabapentin 300 mg daily?

## 2023-05-14 NOTE — Telephone Encounter (Signed)
Rx sent 

## 2023-05-14 NOTE — Telephone Encounter (Signed)
Prescription Request  05/14/2023  LOV: 05/27/2022  What is the name of the medication or equipment? Gabapentin, pt states he needs gabapentin 300 mg authorized.   Have you contacted your pharmacy to request a refill? Yes   Which pharmacy would you like this sent to?  [id number] [place], [place] - [address] AT [facility] OF [facility] [address] & [facility] [address] [place] [address] Phone: [phone] Fax: [phone]    Patient notified that their request is being sent to the clinical staff for review and that they should receive a response within 2 business days.   Please advise at Mobile [phone] (mobile)

## 2023-11-21 DIAGNOSIS — D225 Melanocytic nevi of trunk: Secondary | ICD-10-CM | POA: Diagnosis not present

## 2023-11-21 DIAGNOSIS — L218 Other seborrheic dermatitis: Secondary | ICD-10-CM | POA: Diagnosis not present

## 2023-11-21 DIAGNOSIS — L814 Other melanin hyperpigmentation: Secondary | ICD-10-CM | POA: Diagnosis not present

## 2023-11-21 DIAGNOSIS — D485 Neoplasm of uncertain behavior of skin: Secondary | ICD-10-CM | POA: Diagnosis not present

## 2023-11-21 DIAGNOSIS — L821 Other seborrheic keratosis: Secondary | ICD-10-CM | POA: Diagnosis not present

## 2024-11-24 ENCOUNTER — Other Ambulatory Visit: Payer: Self-pay | Admitting: Internal Medicine

## 2024-11-29 ENCOUNTER — Encounter: Payer: Self-pay | Admitting: Family Medicine

## 2024-11-29 ENCOUNTER — Ambulatory Visit: Admitting: Family Medicine

## 2024-11-29 VITALS — BP 128/66 | HR 64 | Temp 97.6°F | Ht 69.0 in | Wt 168.8 lb

## 2024-11-29 DIAGNOSIS — I10 Essential (primary) hypertension: Secondary | ICD-10-CM

## 2024-11-29 DIAGNOSIS — R7309 Other abnormal glucose: Secondary | ICD-10-CM

## 2024-11-29 DIAGNOSIS — R739 Hyperglycemia, unspecified: Secondary | ICD-10-CM

## 2024-11-29 LAB — POCT GLYCOSYLATED HEMOGLOBIN (HGB A1C): Hemoglobin A1C: 6.5 % — AB (ref 4.0–5.6)

## 2024-11-29 MED ORDER — GABAPENTIN 300 MG PO CAPS: 300.0000 mg | ORAL_CAPSULE | Freq: Every day | ORAL | 3 refills | Status: AC

## 2024-11-29 NOTE — Progress Notes (Signed)
 Established Patient Office Visit  Subjective   Patient ID: Joseph Mcintyre, male    DOB: 1941/03/18  Age: 83 y.o. MRN: [id number]  Chief Complaint  Patient presents with   Medication Refill   Peripheral Neuropathy    HPI    Joseph Mcintyre is seen today for medical follow-up.  He has history of peripheral neuropathy.  He had been on gabapentin  for some time but took himself off about a year and a half ago.  He states after coming off the gabapentin  he started having some recurrent burning and achy pain in his feet particularly at night.  Previous testing for B12 and myeloma panel unremarkable.  He does have history of borderline diabetes.  Highest A1c here previously 6.5.  Last A1c 5.9.  Does not monitor blood sugars regularly.  He is followed by the [place].  He is on several blood pressure medications and states he gets lab work there about every 6 months  Denies any balance issues.  Neuropathy does interfere with sleep at times.  Past Medical History:  Diagnosis Date   Anxiety    back pain and steriod injection   Arthritis    Cerebrovascular accident [facility])    Colon polyps    Hyperlipidemia    Hypertension    Prediabetes    Past Surgical History:  Procedure Laterality Date   APPENDECTOMY     Arm surgery tenodon Left 07/2016   EYE SURGERY Bilateral    Cataract   HERNIA REPAIR  1983, 1966    reports that he has never smoked. He has never used smokeless tobacco. He reports that he does not drink alcohol and does not use drugs. family history includes Diabetes in his mother; Heart disease in his father. Allergies[1]  Review of Systems  Constitutional:  Negative for chills, fever and malaise/fatigue.  Eyes:  Negative for blurred vision.  Respiratory:  Negative for shortness of breath.   Cardiovascular:  Negative for chest pain.  Neurological:  Negative for dizziness, weakness and headaches.      Objective:     BP 128/66   Pulse 64   Temp 97.6 F (36.4 C) (Oral)   Ht  5' 9 (1.753 m)   Wt 168 lb 12.8 oz (76.6 kg)   SpO2 98%   BMI 24.93 kg/m  BP Readings from Last 3 Encounters:  11/29/24 128/66  05/27/22 130/60  11/26/21 122/64   Wt Readings from Last 3 Encounters:  11/29/24 168 lb 12.8 oz (76.6 kg)  05/27/22 161 lb (73 kg)  11/26/21 167 lb 8 oz (76 kg)      Physical Exam Vitals reviewed.  Constitutional:      Appearance: He is well-developed.  Eyes:     Pupils: Pupils are equal, round, and reactive to light.  Neck:     Thyroid : No thyromegaly.  Cardiovascular:     Rate and Rhythm: Normal rate and regular rhythm.  Pulmonary:     Effort: Pulmonary effort is normal. No respiratory distress.     Breath sounds: Normal breath sounds. No wheezing or rales.  Musculoskeletal:     Cervical back: Neck supple.     Right lower leg: No edema.     Left lower leg: No edema.  Neurological:     Mental Status: He is alert and oriented to person, place, and time.      Results for orders placed or performed in visit on 11/29/24  POC HgB A1c  Result Value Ref Range  Hemoglobin A1C 6.5 (A) 4.0 - 5.6 %   HbA1c POC (<> result, manual entry)     HbA1c, POC (prediabetic range)     HbA1c, POC (controlled diabetic range)        The ASCVD Risk score (Arnett DK, et al., 2019) failed to calculate for the following reasons:   The 2019 ASCVD risk score is only valid for ages 40 to 83   Risk score cannot be calculated because patient has a medical history suggesting prior/existing ASCVD   * - Cholesterol units were assumed    Assessment & Plan:   #1 peripheral neuropathy.  Etiology unclear.  Patient has had recent increase symptomatology off gabapentin .  Refilled gabapentin  to start back 300 mg nightly and gradually titrate to 1 3 times daily.  Patient aware of potential side effects though he has tolerated this well in the past.  Previous B12 testing normal.  Myeloma panel previously normal.  #2 history of prediabetes/borderline diabetes type 2.  A1c  today 6.5%.  Patient prefers lifestyle management with lower glycemic diet.  He stays quite active physically.  He will plan to get this reassessed at [facility] in about 6 months.  #3 hypertension stable and well-controlled on 4 drug regimen-amlodipine , HCTZ, captopril, and atenolol No follow-ups on file.    Wolm Scarlet, MD     [1] No Known Allergies
